# Patient Record
Sex: Male | Born: 1970
Health system: Southern US, Community
[De-identification: ages and names within clinical notes are randomized; demographics above are authoritative.]

## PROBLEM LIST (undated history)

## (undated) DIAGNOSIS — S2249XA Multiple fractures of ribs, unspecified side, initial encounter for closed fracture: Secondary | ICD-10-CM

## (undated) DIAGNOSIS — J189 Pneumonia, unspecified organism: Secondary | ICD-10-CM

## (undated) DIAGNOSIS — K859 Acute pancreatitis without necrosis or infection, unspecified: Secondary | ICD-10-CM

## (undated) DIAGNOSIS — F1090 Alcohol use, unspecified, uncomplicated: Secondary | ICD-10-CM

## (undated) DIAGNOSIS — D649 Anemia, unspecified: Secondary | ICD-10-CM

## (undated) HISTORY — PX: ROTATOR CUFF REPAIR: SHX139

---

## 2008-09-23 ENCOUNTER — Emergency Department (HOSPITAL_BASED_OUTPATIENT_CLINIC_OR_DEPARTMENT_OTHER): Admission: EM | Admit: 2008-09-23 | Discharge: 2008-09-23 | Payer: Self-pay | Admitting: Emergency Medicine

## 2008-11-16 ENCOUNTER — Ambulatory Visit: Payer: Self-pay | Admitting: Radiology

## 2008-11-16 ENCOUNTER — Emergency Department (HOSPITAL_BASED_OUTPATIENT_CLINIC_OR_DEPARTMENT_OTHER): Admission: EM | Admit: 2008-11-16 | Discharge: 2008-11-16 | Payer: Self-pay | Admitting: Emergency Medicine

## 2009-10-01 ENCOUNTER — Emergency Department (HOSPITAL_BASED_OUTPATIENT_CLINIC_OR_DEPARTMENT_OTHER): Admission: EM | Admit: 2009-10-01 | Discharge: 2009-10-01 | Payer: Self-pay | Admitting: Emergency Medicine

## 2010-07-02 LAB — RPR: RPR Ser Ql: NONREACTIVE

## 2014-03-06 ENCOUNTER — Emergency Department (HOSPITAL_BASED_OUTPATIENT_CLINIC_OR_DEPARTMENT_OTHER)
Admission: EM | Admit: 2014-03-06 | Discharge: 2014-03-06 | Disposition: A | Discharge status: Home / Self Care | Payer: 59 | Attending: Emergency Medicine | Admitting: Emergency Medicine

## 2014-03-06 ENCOUNTER — Emergency Department (HOSPITAL_BASED_OUTPATIENT_CLINIC_OR_DEPARTMENT_OTHER): Payer: 59

## 2014-03-06 ENCOUNTER — Encounter (HOSPITAL_BASED_OUTPATIENT_CLINIC_OR_DEPARTMENT_OTHER): Payer: Self-pay | Admitting: *Deleted

## 2014-03-06 DIAGNOSIS — M546 Pain in thoracic spine: Secondary | ICD-10-CM

## 2014-03-06 DIAGNOSIS — Z720 Tobacco use: Secondary | ICD-10-CM | POA: Diagnosis not present

## 2014-03-06 DIAGNOSIS — R109 Unspecified abdominal pain: Secondary | ICD-10-CM | POA: Insufficient documentation

## 2014-03-06 DIAGNOSIS — M549 Dorsalgia, unspecified: Secondary | ICD-10-CM | POA: Diagnosis present

## 2014-03-06 LAB — URINALYSIS, ROUTINE W REFLEX MICROSCOPIC
Bilirubin Urine: NEGATIVE
GLUCOSE, UA: NEGATIVE mg/dL
HGB URINE DIPSTICK: NEGATIVE
Ketones, ur: NEGATIVE mg/dL
LEUKOCYTES UA: NEGATIVE
Nitrite: NEGATIVE
PROTEIN: NEGATIVE mg/dL
SPECIFIC GRAVITY, URINE: 1.015 (ref 1.005–1.030)
Urobilinogen, UA: 1 mg/dL (ref 0.0–1.0)
pH: 6 (ref 5.0–8.0)

## 2014-03-06 MED ORDER — METHOCARBAMOL 500 MG PO TABS: 500.0000 mg | ORAL_TABLET | Freq: Two times a day (BID) | ORAL | Status: DC

## 2014-03-06 MED ORDER — NAPROXEN 500 MG PO TABS: 500.0000 mg | ORAL_TABLET | Freq: Two times a day (BID) | ORAL | Status: DC

## 2014-03-06 NOTE — ED Notes (Signed)
C/o rt mid to lower flank pain. Onset yesterday am when he woke up. Denies heavy lifting prior. No problems urinating.

## 2014-03-06 NOTE — Discharge Instructions (Signed)
Recheck with cough or fever, rash along the painful area, or any other changes. Avoid heavy lifting bending or physical activity until your symptoms have improved.  Back Pain, Adult Low back pain is very common. About 1 in 5 people have back pain.The cause of low back pain is rarely dangerous. The pain often gets better over time.About half of people with a sudden onset of back pain feel better in just 2 weeks. About 8 in 10 people feel better by 6 weeks.  CAUSES Some common causes of back pain include:  Strain of the muscles or ligaments supporting the spine.  Wear and tear (degeneration) of the spinal discs.  Arthritis.  Direct injury to the back. DIAGNOSIS Most of the time, the direct cause of low back pain is not known.However, back pain can be treated effectively even when the exact cause of the pain is unknown.Answering your caregiver's questions about your overall health and symptoms is one of the most accurate ways to make sure the cause of your pain is not dangerous. If your caregiver needs more information, he or she may order lab work or imaging tests (X-rays or MRIs).However, even if imaging tests show changes in your back, this usually does not require surgery. HOME CARE INSTRUCTIONS For many people, back pain returns.Since low back pain is rarely dangerous, it is often a condition that people can learn to [facility]ageon their own.   Remain active. It is stressful on the back to sit or stand in one place. Do not sit, drive, or stand in one place for more than 30 minutes at a time. Take short walks on level surfaces as soon as pain allows.Try to increase the length of time you walk each day.  Do not stay in bed.Resting more than 1 or 2 days can delay your recovery.  Do not avoid exercise or work.Your body is made to move.It is not dangerous to be active, even though your back may hurt.Your back will likely heal faster if you return to being active before your pain is  gone.  Pay attention to your body when you bend and lift. Many people have less discomfortwhen lifting if they bend their knees, keep the load close to their bodies,and avoid twisting. Often, the most comfortable positions are those that put less stress on your recovering back.  Find a comfortable position to sleep. Use a firm mattress and lie on your side with your knees slightly bent. If you lie on your back, put a pillow under your knees.  Only take over-the-counter or prescription medicines as directed by your caregiver. Over-the-counter medicines to reduce pain and inflammation are often the most helpful.Your caregiver may prescribe muscle relaxant drugs.These medicines help dull your pain so you can more quickly return to your normal activities and healthy exercise.  Put ice on the injured area.  Put ice in a plastic bag.  Place a towel between your skin and the bag.  Leave the ice on for 15-20 minutes, 03-04 times a day for the first 2 to 3 days. After that, ice and heat may be alternated to reduce pain and spasms.  Ask your caregiver about trying back exercises and gentle massage. This may be of some benefit.  Avoid feeling anxious or stressed.Stress increases muscle tension and can worsen back pain.It is important to recognize when you are anxious or stressed and learn ways to manage it.Exercise is a great option. SEEK MEDICAL CARE IF:  You have pain that is not relieved with  rest or medicine.  You have pain that does not improve in 1 week.  You have new symptoms.  You are generally not feeling well. SEEK IMMEDIATE MEDICAL CARE IF:   You have pain that radiates from your back into your legs.  You develop new bowel or bladder control problems.  You have unusual weakness or numbness in your arms or legs.  You develop nausea or vomiting.  You develop abdominal pain.  You feel faint. Document Released: 03/11/2005 Document Revised: 09/10/2011 Document Reviewed:  07/13/2013 [facility] Patient Information 2015 [place] [employer], [place]. This information is not intended to replace advice given to you by your health care provider. Make sure you discuss any questions you have with your health care provider.

## 2014-03-06 NOTE — ED Provider Notes (Signed)
CSN: [id number]     Arrival date & time 03/06/14  [id number] History   None    Chief Complaint  Patient presents with  . Back Pain     HPI  Patient presented for evaluation of atraumatic right flank pain. Awakened yesterday morning and states his back was sore. Occasional spasms when he lifts or bends. No urinary symptoms. No migration of pain to the anterior abdomen or lower abdomen. No groin or testicle pain. No cough sputum production. No rash or vesicles. No skin sensitivity. No past similar episodes. He does not recall any exact inciting events for pain. 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  History reviewed. No pertinent past medical history. History reviewed. No pertinent past surgical history. No family history on file. History  Substance Use Topics  . Smoking status: Current Every Day Smoker -- 1.00 packs/day  . Smokeless tobacco: Not on file  . Alcohol Use: Not on file    Review of Systems  Constitutional: Negative for fever, chills, diaphoresis, appetite change and fatigue.  HENT: Negative for mouth sores, sore throat and trouble swallowing.   Eyes: Negative for visual disturbance.  Respiratory: Negative for cough, chest tightness, shortness of breath and wheezing.   Cardiovascular: Negative for chest pain.  Gastrointestinal: Negative for nausea, vomiting, abdominal pain, diarrhea and abdominal distention.  Endocrine: Negative for polydipsia, polyphagia and polyuria.  Genitourinary: Positive for flank pain. Negative for dysuria, frequency and hematuria.  Musculoskeletal: Negative for gait problem.  Skin: Negative for color change, pallor and rash.  Neurological: Negative for dizziness, syncope, light-headedness and headaches.  Hematological: Does not bruise/bleed easily.  Psychiatric/Behavioral: Negative for behavioral problems and confusion.      Allergies  Review of patient's allergies indicates no known allergies.  Home Medications   Prior to  Admission medications   Medication Sig Start Date End Date Taking? Authorizing Provider  methocarbamol (ROBAXIN) 500 MG tablet Take 1 tablet (500 mg total) by mouth 2 (two) times daily. 03/06/14   Rolland PorterMark Raylene Carmickle, MD  naproxen (NAPROSYN) 500 MG tablet Take 1 tablet (500 mg total) by mouth 2 (two) times daily. 03/06/14   Rolland PorterMark Rena Sweeden, MD   BP 142/78 mmHg  Pulse 109  Temp(Src) 98.6 F (37 C) (Oral)  Resp 18  Ht 5\' 10"  (1.778 m)  Wt 180 lb (81.647 kg)  BMI 25.83 kg/m2  SpO2 97% Physical Exam  Constitutional: He is oriented to person, place, and time. He appears well-developed and well-nourished. No distress.  HENT:  Head: Normocephalic.  Eyes: Conjunctivae are normal. Pupils are equal, round, and reactive to light. No scleral icterus.  Neck: Normal range of motion. Neck supple. No thyromegaly present.  Cardiovascular: Normal rate and regular rhythm.  Exam reveals no gallop and no friction rub.   No murmur heard. Pulmonary/Chest: Effort normal and breath sounds normal. No respiratory distress. He has no wheezes. He has no rales.  Abdominal: Soft. Bowel sounds are normal. He exhibits no distension. There is no tenderness. There is no rebound.  Musculoskeletal: Normal range of motion.       Back:  Neurological: He is alert and oriented to person, place, and time.  Skin: Skin is warm and dry. No rash noted.  Psychiatric: He has a normal mood and affect. His behavior is normal.    ED Course  Procedures (including critical care time) Labs Review Labs Reviewed  URINALYSIS, ROUTINE W REFLEX MICROSCOPIC    Imaging Review Dg Chest 2 View  03/06/2014  CLINICAL DATA:  43 year old male with 1 day history of right upper back pain  EXAM: CHEST  2 VIEW  COMPARISON:  Prior chest x-ray 11/16/2008  FINDINGS: The lungs are clear and negative for focal airspace consolidation, pulmonary edema or suspicious pulmonary nodule. No pleural effusion or pneumothorax. Cardiac and mediastinal contours are within  normal limits. No acute fracture or lytic or blastic osseous lesions. The visualized upper abdominal bowel gas pattern is unremarkable.  IMPRESSION: Negative chest x-ray.   Electronically Signed   By: Malachy MoanHeath  McCullough M.D.   On: 03/06/2014 10:34     EKG Interpretation None      MDM   Final diagnoses:  Thoracic back pain    Pain is reproducible on exam. Clear lungs. Normal chest x-ray. No colic symptoms and normal urine. No rash, or vesicles to suggest zoster. Discussed with him to be reevaluated should he develop any signs suggestive of zoster. Otherwise plan is anti-inflammatories muscle relaxants expectant management. Avoid she knows activity heavy lifting or bending recheck with any evolution of symptoms.    Rolland PorterMark Maitri Schnoebelen, MD 03/06/14 [phone]

## 2015-10-14 ENCOUNTER — Other Ambulatory Visit: Payer: Self-pay | Admitting: Family Medicine

## 2015-10-14 DIAGNOSIS — R748 Abnormal levels of other serum enzymes: Secondary | ICD-10-CM

## 2015-10-24 ENCOUNTER — Ambulatory Visit
Admission: RE | Admit: 2015-10-24 | Discharge: 2015-10-24 | Disposition: A | Discharge status: Home / Self Care | Payer: 59 | Source: Ambulatory Visit | Attending: Family Medicine | Admitting: Family Medicine

## 2015-10-24 DIAGNOSIS — R748 Abnormal levels of other serum enzymes: Secondary | ICD-10-CM

## 2016-03-27 MED FILL — HYDROCODON-APAP 5-325: 5-325 | 3 days supply | Qty: 15 | Fill #0

## 2016-12-11 ENCOUNTER — Encounter (HOSPITAL_BASED_OUTPATIENT_CLINIC_OR_DEPARTMENT_OTHER): Payer: Self-pay | Admitting: *Deleted

## 2016-12-11 ENCOUNTER — Emergency Department (HOSPITAL_BASED_OUTPATIENT_CLINIC_OR_DEPARTMENT_OTHER)
Admission: EM | Admit: 2016-12-11 | Discharge: 2016-12-11 | Disposition: A | Discharge status: Home / Self Care | Payer: 59 | Attending: Emergency Medicine | Admitting: Emergency Medicine

## 2016-12-11 ENCOUNTER — Emergency Department (HOSPITAL_BASED_OUTPATIENT_CLINIC_OR_DEPARTMENT_OTHER): Payer: 59

## 2016-12-11 DIAGNOSIS — R109 Unspecified abdominal pain: Secondary | ICD-10-CM | POA: Diagnosis not present

## 2016-12-11 DIAGNOSIS — F17200 Nicotine dependence, unspecified, uncomplicated: Secondary | ICD-10-CM | POA: Diagnosis not present

## 2016-12-11 DIAGNOSIS — K859 Acute pancreatitis without necrosis or infection, unspecified: Secondary | ICD-10-CM | POA: Insufficient documentation

## 2016-12-11 DIAGNOSIS — R1031 Right lower quadrant pain: Secondary | ICD-10-CM | POA: Diagnosis not present

## 2016-12-11 HISTORY — DX: Acute pancreatitis without necrosis or infection, unspecified: K85.90

## 2016-12-11 LAB — URINALYSIS, ROUTINE W REFLEX MICROSCOPIC
GLUCOSE, UA: NEGATIVE mg/dL
KETONES UR: NEGATIVE mg/dL
Leukocytes, UA: NEGATIVE
Nitrite: NEGATIVE
PH: 6 (ref 5.0–8.0)
PROTEIN: NEGATIVE mg/dL
SPECIFIC GRAVITY, URINE: 1.025 (ref 1.005–1.030)

## 2016-12-11 LAB — CBC
HEMATOCRIT: 37.5 % — AB (ref 39.0–52.0)
HEMOGLOBIN: 13.3 g/dL (ref 13.0–17.0)
MCH: 36.4 pg — ABNORMAL HIGH (ref 26.0–34.0)
MCHC: 35.5 g/dL (ref 30.0–36.0)
MCV: 102.7 fL — AB (ref 78.0–100.0)
Platelets: 221 10*3/uL (ref 150–400)
RBC: 3.65 MIL/uL — ABNORMAL LOW (ref 4.22–5.81)
RDW: 11.9 % (ref 11.5–15.5)
WBC: 7.8 10*3/uL (ref 4.0–10.5)

## 2016-12-11 LAB — COMPREHENSIVE METABOLIC PANEL
ALBUMIN: 4 g/dL (ref 3.5–5.0)
ALT: 75 U/L — ABNORMAL HIGH (ref 17–63)
ANION GAP: 11 (ref 5–15)
AST: 116 U/L — AB (ref 15–41)
Alkaline Phosphatase: 110 U/L (ref 38–126)
BUN: 9 mg/dL (ref 6–20)
CHLORIDE: 100 mmol/L — AB (ref 101–111)
CO2: 24 mmol/L (ref 22–32)
Calcium: 9.1 mg/dL (ref 8.9–10.3)
Creatinine, Ser: 1.1 mg/dL (ref 0.61–1.24)
GFR calc Af Amer: >60 mL/min (ref >60)
GFR calc non Af Amer: >60 mL/min (ref >60)
GLUCOSE: 117 mg/dL — AB (ref 65–99)
POTASSIUM: 3.9 mmol/L (ref 3.5–5.1)
SODIUM: 135 mmol/L (ref 135–145)
Total Bilirubin: 1.2 mg/dL (ref 0.3–1.2)
Total Protein: 8.3 g/dL — ABNORMAL HIGH (ref 6.5–8.1)

## 2016-12-11 LAB — URINALYSIS, MICROSCOPIC (REFLEX): WBC, UA: NONE SEEN WBC/hpf (ref 0–5)

## 2016-12-11 LAB — LIPASE, BLOOD: LIPASE: 80 U/L — AB (ref 11–51)

## 2016-12-11 MED ORDER — ONDANSETRON HCL 4 MG/2ML IJ SOLN
4.0000 mg | Freq: Once | INTRAMUSCULAR | Status: AC
  Administered 2016-12-11: 4 mg via INTRAVENOUS
  Filled 2016-12-11: qty 2

## 2016-12-11 MED ORDER — IOPAMIDOL (ISOVUE-300) INJECTION 61%
100.0000 mL | Freq: Once | INTRAVENOUS | Status: AC | PRN
  Administered 2016-12-11: 100 mL via INTRAVENOUS

## 2016-12-11 MED ORDER — MORPHINE SULFATE (PF) 4 MG/ML IV SOLN
4.0000 mg | Freq: Once | INTRAVENOUS | Status: AC
  Administered 2016-12-11: 4 mg via INTRAVENOUS
  Filled 2016-12-11: qty 1

## 2016-12-11 MED ORDER — SODIUM CHLORIDE 0.9 % IV BOLUS (SEPSIS)
1000.0000 mL | Freq: Once | INTRAVENOUS | Status: AC
  Administered 2016-12-11: 1000 mL via INTRAVENOUS

## 2016-12-11 MED ORDER — HYDROCODONE-ACETAMINOPHEN 5-325 MG PO TABS: 1.0000 | ORAL_TABLET | Freq: Four times a day (QID) | ORAL | 0 refills | Status: DC | PRN

## 2016-12-11 MED ORDER — MORPHINE SULFATE (PF) 4 MG/ML IV SOLN
4.0000 mg | Freq: Once | INTRAVENOUS | Status: AC
Start: 2016-12-11 — End: 2016-12-11
  Administered 2016-12-11: 4 mg via INTRAVENOUS
  Filled 2016-12-11: qty 1

## 2016-12-11 MED ORDER — ONDANSETRON 4 MG PO TBDP: 4.0000 mg | ORAL_TABLET | Freq: Three times a day (TID) | ORAL | 0 refills | Status: DC | PRN

## 2016-12-11 NOTE — ED Notes (Signed)
ED Provider at bedside. 

## 2016-12-11 NOTE — ED Triage Notes (Signed)
Pt c/o lower back pain and abd, nausea x 1 day

## 2016-12-11 NOTE — ED Provider Notes (Signed)
[facility]   CSN: [id number] Arrival date & time: 12/11/16  1454     History   Chief Complaint Chief Complaint  Patient presents with  . Abdominal Pain    HPI Jamie Campos is a 46 y.o. male with no significant past medical history presenting with 24 hours of right lower quadrant pain radiating to his back. He also endorses associated nausea but no vomiting, denies diarrhea. Has had a normal bowel movement this morning. Denies any penile discharge, groin pain, testicular swelling or pain. No prior history of abdominal surgeries Denies dysuria, hematuria, reports dark urine and poor hydration status.   Patient is a current every day smoker 1 pack per day and daily alcohol consumer.  HPI  Past Medical History:  Diagnosis Date  . Acute pancreatitis     There are no active problems to display for this patient.   Past Surgical History:  Procedure Laterality Date  . ROTATOR CUFF REPAIR         Home Medications    Prior to Admission medications   Medication Sig Start Date End Date Taking? Authorizing Provider  HYDROcodone-acetaminophen (NORCO/VICODIN) 5-325 MG tablet Take 1 tablet by mouth every 6 (six) hours as needed for severe pain. 12/11/16   Mathews Robinsons B, PA-C  ondansetron (ZOFRAN ODT) 4 MG disintegrating tablet Take 1 tablet (4 mg total) by mouth every 8 (eight) hours as needed for nausea or vomiting. 12/11/16   Georgiana Shore, PA-C    Family History History reviewed. No pertinent family history.  Social History Social History  Substance Use Topics  . Smoking status: Current Every Day Smoker    Packs/day: 1.00  . Smokeless tobacco: Never Used  . Alcohol use 1.8 oz/week    3 Cans of beer per week     Allergies   Patient has no known allergies.   Review of Systems Review of Systems  Constitutional: Negative for chills, diaphoresis, fatigue and fever.  Eyes: Negative for pain and visual disturbance.    Respiratory: Negative for cough, shortness of breath, wheezing and stridor.   Cardiovascular: Negative for chest pain and palpitations.  Gastrointestinal: Positive for abdominal pain and nausea. Negative for abdominal distention, blood in stool, diarrhea and vomiting.  Genitourinary: Negative for difficulty urinating, dysuria, flank pain, frequency and hematuria.  Musculoskeletal: Negative for arthralgias, back pain, myalgias, neck pain and neck stiffness.  Skin: Negative for color change, pallor and rash.  Neurological: Negative for dizziness, seizures, syncope, weakness, light-headedness and headaches.     Physical Exam Updated Vital Signs BP (!) 134/99 (BP Location: Right Arm)   Pulse 90   Temp 98.4 F (36.9 C)   Resp 18   Ht  (1.778 m)   Wt 77.1 kg (170 lb)   SpO2 95%   BMI 24.39 kg/m   Physical Exam  Constitutional: He appears well-developed and well-nourished. No distress.  Afebrile, nontoxic-appearing, lying in bed in mild discomfort.  HENT:  Head: Normocephalic and atraumatic.  Mouth/Throat: Oropharynx is clear and moist. No oropharyngeal exudate.  Eyes: Conjunctivae and EOM are normal.  Neck: Normal range of motion. Neck supple.  Cardiovascular: Normal rate, regular rhythm and normal heart sounds.   No murmur heard. Pulmonary/Chest: Effort normal and breath sounds normal. No respiratory distress. He has no wheezes. He has no rales.  Abdominal: Soft. Bowel sounds are normal. He exhibits no distension and no mass. There is tenderness. There is no rebound and no guarding.  Tenderness to  palpation of the right lower quadrant and right upper quadrant. No peritoneal signs, no distention. Negative Murphy sign.   Musculoskeletal: Normal range of motion. He exhibits no edema.  Neurological: He is alert.  Skin: Skin is warm and dry. No rash noted. He is not diaphoretic. No erythema. No pallor.  Psychiatric: He has a normal mood and affect.  Nursing note and vitals  reviewed.    ED Treatments / Results  Labs (all labs ordered are listed, but only abnormal results are displayed) Labs Reviewed  LIPASE, BLOOD - Abnormal; Notable for the following:       Result Value   Lipase 80 (*)    All other components within normal limits  COMPREHENSIVE METABOLIC PANEL - Abnormal; Notable for the following:    Chloride 100 (*)    Glucose, Bld 117 (*)    Total Protein 8.3 (*)    AST 116 (*)    ALT 75 (*)    All other components within normal limits  CBC - Abnormal; Notable for the following:    RBC 3.65 (*)    HCT 37.5 (*)    MCV 102.7 (*)    MCH 36.4 (*)    All other components within normal limits  URINALYSIS, ROUTINE W REFLEX MICROSCOPIC - Abnormal; Notable for the following:    Hgb urine dipstick TRACE (*)    Bilirubin Urine SMALL (*)    All other components within normal limits  URINALYSIS, MICROSCOPIC (REFLEX) - Abnormal; Notable for the following:    Bacteria, UA RARE (*)    Squamous Epithelial / LPF 0-5 (*)    All other components within normal limits    EKG  EKG Interpretation None       Radiology Ct Abdomen Pelvis W Contrast  Result Date: 12/11/2016 CLINICAL DATA:  Abdominal pain EXAM: CT ABDOMEN AND PELVIS WITH CONTRAST TECHNIQUE: Multidetector CT imaging of the abdomen and pelvis was performed using the standard protocol following bolus administration of intravenous contrast. Oral contrast was also administered. CONTRAST:  ISOVUE-300 IOPAMIDOL (ISOVUE-300) INJECTION 61% COMPARISON:  None. FINDINGS: Lower chest: Lung bases are clear. Hepatobiliary: There is hepatic steatosis. There is inhomogeneous decreased attenuation throughout the liver, most marked involving the posterior segment of the right lobe of the liver medially. This appearance is suggestive of more severe fatty infiltration in these areas compared to the remainder of the liver. There is no disruption of hepatic architecture. Gallbladder wall is not appreciably  thickened. There is no biliary duct dilatation. Pancreas: There is focal prominence of the head of the pancreas without evident mass. The remainder of the pancreas appears normal. There is peripancreatic soft tissue stranding and fluid adjacent to the head and uncinate process of the pancreas which extends to the level of the distal stomach and proximal duodenum. Fluid extends to the right of the duodenum to the level of the gallbladder. A small amount of stranding extends more inferiorly to surround a portion of the proximal third portion of duodenum. This appearance is felt to be indicative of acute pancreatitis. There is no pseudocyst or pancreatic duct dilatation. Spleen: Spleen is normal in size and contour. No splenic lesions are evident. Adrenals/Urinary Tract: Adrenals appear normal bilaterally. There is a cyst in the posterior mid right kidney measuring 2.4 x 2.1 cm. There is no appreciable hydronephrosis on either side. There is no renal or ureteral calculus on either side. Urinary bladder is midline with wall thickness within normal limits. Stomach/Bowel: There is wall thickening in  the distal gastric antrum and proximal duodenum, likely due to the adjacent peripancreatic fluid. There is also mild wall thickening in the proximal third portion the duodenum, likely also due to the adjacent peripancreatic fluid. There is no other bowel wall thickening. No bowel obstruction evident. No free air or portal venous air. Vascular/Lymphatic: There is atherosclerotic calcification in the aorta and common iliac arteries. There is also calcification in both internal iliac arteries. No evident aneurysm. Major mesenteric vessels appear patent. There is a retroaortic left renal vein, an anatomic variant. There is no evident adenopathy in the abdomen or pelvis. Reproductive: Prostate and seminal vesicles are normal in size and contour. No pelvic mass evident. Other: Appendix appears normal. There is no abscess or ascites  in the abdomen or pelvis. Musculoskeletal: There are no blastic or lytic bone lesions. There is no intramuscular or abdominal wall lesion. IMPRESSION: 1. Evidence of acute pancreatitis involving portions of the head and uncinate process of the pancreas. There is edema in the head of pancreas. There is peripancreatic fluid surrounding the head and uncinate process extending lateral to surround much of the proximal duodenum as well as to extend to the gallbladder without causing gallbladder wall thickening. Fluid extends anteriorly to abut the gastric antrum and posteriorly to abut the third portion of the duodenum. No pancreatic mass is demonstrable. The body and tail of the pancreas appear normal. 2. Distal gastritis and areas of duodenitis, likely due to adjacent peripancreatic fluid. No bowel obstruction. No abscess noted in the abdomen or pelvis. 3.  Appendix appears normal. 4. Hepatic steatosis. The liver as a somewhat unusual appearance with multiple areas of decreased attenuation beyond the generalized hepatic steatosis. Suspect that these areas represent more severe fatty infiltration than elsewhere; note that there is no hepatic architecture disruption on this study. Given this somewhat unusual appearance, abdominal MRI pre and post-contrast non emergently is advised to further evaluate. MR also could be helpful further assessing the changes of apparent pancreatitis, in particular to exclude possible subtle underlying mass, not seen on the CT examination. 5.  Aortoiliac atherosclerosis, somewhat advanced for age. 6.  No renal or ureteral calculus.  No hydronephrosis. Aortic Atherosclerosis (ICD10-I70.0). Electronically Signed   By: Bretta Bang III M.D.   On: 12/11/2016 19:05    Procedures Procedures (including critical care time)  Medications Ordered in ED Medications  ondansetron (ZOFRAN) injection 4 mg (4 mg Intravenous Given 12/11/16 1706)  morphine 4 MG/ML injection 4 mg (4 mg Intravenous  Given 12/11/16 1706)  sodium chloride 0.9 % bolus 1,000 mL (0 mLs Intravenous Stopped 12/11/16 1839)  iopamidol (ISOVUE-300) 61 % injection 100 mL (100 mLs Intravenous Contrast Given 12/11/16 1838)  morphine 4 MG/ML injection 4 mg (4 mg Intravenous Given 12/11/16 1900)     Initial Impression / Assessment and Plan / ED Course  I have reviewed the triage vital signs and the nursing notes.  Pertinent labs & imaging results that were available during my care of the patient were reviewed by me and considered in my medical decision making (see chart for details).    Patient presenting with sudden onset abdominal pain with associated nausea.  CT with evidence of acute pancreatitis, lipase 80. No other acute findings. Patient received fluids and pain was managed. He improved while in the emergency department. successful PO challenge.  Discussed smoking cessation and reduced alcohol consumption with patient and advised to follow-up with primary care for assistance with this.  Discharge with gastroenterology follow-up for further evaluation  of incidental findings on CT recommending non-emergent MRI. Results discussed with patient who understood and will be following up.  Discharge home with symptomatic relief. Advised to start reintroducing foods slowly and remain well-hydrated.  Discussed strict return precautions and advised to return to the emergency department if experiencing any new or worsening symptoms. Instructions were understood and patient agreed with discharge plan.  Final Clinical Impressions(s) / ED Diagnoses   Final diagnoses:  Acute pancreatitis without infection or necrosis, unspecified pancreatitis type    New Prescriptions Discharge Medication List as of 12/11/2016  8:48 PM    START taking these medications   Details  HYDROcodone-acetaminophen (NORCO/VICODIN) 5-325 MG tablet Take 1 tablet by mouth every 6 (six) hours as needed for severe pain., Starting Wed 12/11/2016, Print     ondansetron (ZOFRAN ODT) 4 MG disintegrating tablet Take 1 tablet (4 mg total) by mouth every 8 (eight) hours as needed for nausea or vomiting., Starting Wed 12/11/2016, Print         Georgiana Shore, PA-C 12/11/16 2115    Rolland Porter, MD 12/24/16 2027

## 2016-12-11 NOTE — ED Notes (Signed)
Pt and family are asking about test results. Encouraged PA to go speak with pt.

## 2016-12-11 NOTE — Discharge Instructions (Signed)
As discussed, please follow up with your Primary care provider and gastroenterology. Make sure that you stay well-hydrated drinking plenty of fluids to keep your urine clear. Only take pain medicine as needed for severe pain. Zofran as needed for nausea. Start reintroducing foods slowly starting with liquids, broths, soups, and bland foods.  Avoid alcohol, spicy foods, smoking. Discussed smoking cessation with your primary care provider. Return if symptoms worsen or you experience new concerning symptoms in the meantime.

## 2016-12-12 DIAGNOSIS — K760 Fatty (change of) liver, not elsewhere classified: Secondary | ICD-10-CM | POA: Diagnosis not present

## 2016-12-12 DIAGNOSIS — I7090 Unspecified atherosclerosis: Secondary | ICD-10-CM | POA: Diagnosis not present

## 2016-12-12 DIAGNOSIS — K852 Alcohol induced acute pancreatitis without necrosis or infection: Secondary | ICD-10-CM | POA: Diagnosis not present

## 2016-12-13 ENCOUNTER — Other Ambulatory Visit: Payer: Self-pay | Admitting: Family Medicine

## 2016-12-13 DIAGNOSIS — K760 Fatty (change of) liver, not elsewhere classified: Secondary | ICD-10-CM

## 2016-12-21 ENCOUNTER — Ambulatory Visit
Admission: RE | Admit: 2016-12-21 | Discharge: 2016-12-21 | Disposition: A | Discharge status: Home / Self Care | Payer: 59 | Source: Ambulatory Visit | Attending: Family Medicine | Admitting: Family Medicine

## 2016-12-21 DIAGNOSIS — K760 Fatty (change of) liver, not elsewhere classified: Secondary | ICD-10-CM

## 2016-12-21 DIAGNOSIS — N281 Cyst of kidney, acquired: Secondary | ICD-10-CM | POA: Diagnosis not present

## 2016-12-21 MED ORDER — GADOBENATE DIMEGLUMINE 529 MG/ML IV SOLN
17.0000 mL | Freq: Once | INTRAVENOUS | Status: AC | PRN
  Administered 2016-12-21: 17 mL via INTRAVENOUS

## 2017-06-05 DIAGNOSIS — M19011 Primary osteoarthritis, right shoulder: Secondary | ICD-10-CM | POA: Diagnosis not present

## 2017-06-05 MED FILL — MELOXICAM 15 MG TABLET: 15 | 30 days supply | Qty: 30 | Fill #0

## 2017-06-24 ENCOUNTER — Other Ambulatory Visit: Payer: Self-pay | Admitting: Family Medicine

## 2017-06-24 DIAGNOSIS — R935 Abnormal findings on diagnostic imaging of other abdominal regions, including retroperitoneum: Secondary | ICD-10-CM

## 2017-07-05 ENCOUNTER — Ambulatory Visit
Admission: RE | Admit: 2017-07-05 | Discharge: 2017-07-05 | Disposition: A | Discharge status: Home / Self Care | Payer: 59 | Source: Ambulatory Visit | Attending: Family Medicine | Admitting: Family Medicine

## 2017-07-05 DIAGNOSIS — R935 Abnormal findings on diagnostic imaging of other abdominal regions, including retroperitoneum: Secondary | ICD-10-CM

## 2017-07-05 DIAGNOSIS — K862 Cyst of pancreas: Secondary | ICD-10-CM | POA: Diagnosis not present

## 2017-07-05 MED ORDER — GADOBENATE DIMEGLUMINE 529 MG/ML IV SOLN
17.0000 mL | Freq: Once | INTRAVENOUS | Status: AC | PRN
  Administered 2017-07-05: 17 mL via INTRAVENOUS

## 2017-07-30 DIAGNOSIS — L988 Other specified disorders of the skin and subcutaneous tissue: Secondary | ICD-10-CM | POA: Diagnosis not present

## 2017-07-30 DIAGNOSIS — R300 Dysuria: Secondary | ICD-10-CM | POA: Diagnosis not present

## 2017-07-30 DIAGNOSIS — H209 Unspecified iridocyclitis: Secondary | ICD-10-CM | POA: Diagnosis not present

## 2017-07-30 MED FILL — CIPROFLOXACIN HCL 500 MG TA: 500 | 10 days supply | Qty: 20 | Fill #0

## 2017-07-30 MED FILL — metroNIDAZOLE 500 MG TABS: 500 | 10 days supply | Qty: 20 | Fill #0

## 2017-08-21 DIAGNOSIS — K8500 Idiopathic acute pancreatitis without necrosis or infection: Secondary | ICD-10-CM | POA: Diagnosis not present

## 2017-08-21 DIAGNOSIS — L732 Hidradenitis suppurativa: Secondary | ICD-10-CM | POA: Diagnosis not present

## 2017-08-26 ENCOUNTER — Other Ambulatory Visit: Payer: Self-pay | Admitting: Surgery

## 2017-08-26 DIAGNOSIS — K8500 Idiopathic acute pancreatitis without necrosis or infection: Secondary | ICD-10-CM

## 2017-09-19 ENCOUNTER — Ambulatory Visit
Admission: RE | Admit: 2017-09-19 | Discharge: 2017-09-19 | Disposition: A | Discharge status: Home / Self Care | Payer: 59 | Source: Ambulatory Visit | Attending: Surgery | Admitting: Surgery

## 2017-09-19 DIAGNOSIS — K8500 Idiopathic acute pancreatitis without necrosis or infection: Secondary | ICD-10-CM

## 2018-07-07 IMAGING — MR MR ABDOMEN WO/W CM
13 of 19 series · 28 of 48 positions shown · IV contrast (17 ml multihance)
Comparison: 12/21/2016

CLINICAL DATA: Followup indeterminate pancreatic lesion.

EXAM:
MRI ABDOMEN WITHOUT AND WITH CONTRAST
TECHNIQUE: Multiplanar multisequence MR imaging of the abdomen was performed
both before and after the administration of intravenous contrast.
CONTRAST:  17mL MULTIHANCE GADOBENATE DIMEGLUMINE 529 MG/ML IV SOLN

[Series 4: T2 · coronal · 5.0mm · 1.41mm/px · 1 of 29 slices shown (1 of 3)]
[im 1/29]
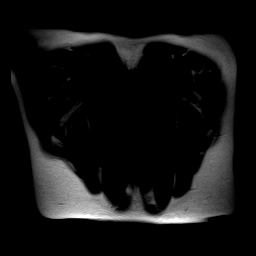

[Series 5: T2 · axial · 5.0mm · 1.41mm/px · 1 of 34 slices shown (2 of 3)]
[im 1/34]
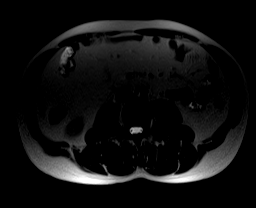

[Series 6: MRCP fat-sat · coronal · 3.0mm · 0.70mm/px · 1 of 21 slices shown]
[im 1/21]
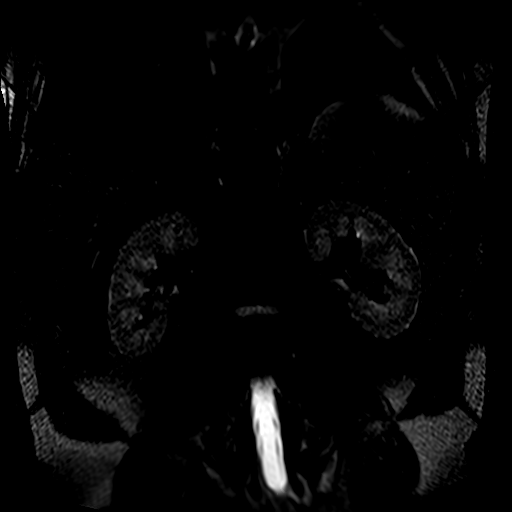

[Series 7: axial in out · axial · 5.5mm · 0.70mm/px · 1 of 68 slices shown]
[im 1/68]
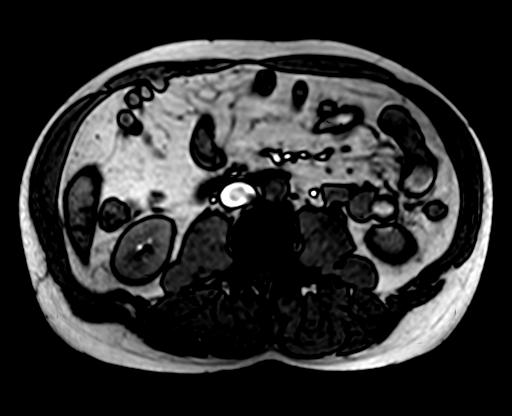

[Series 8: T2 · axial · 5.0mm · 0.70mm/px · 1 of 38 slices shown (3 of 3)]
[im 1/38]
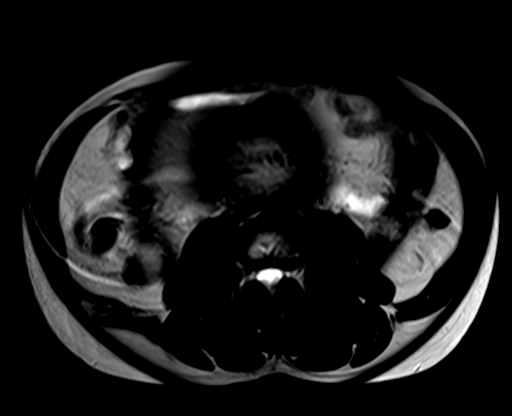

[Series 9: ep2d_diff_b50_500_800_p2_trig · axial · 5.0mm · 1.88mm/px · z∈[-91,+134]mm · 3 of 111 slices shown]
[im 1/111]
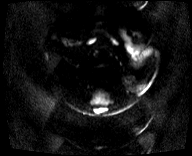
[im 56/111]
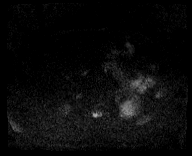
[im 111/111]
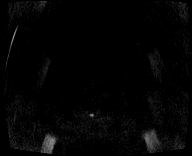

[Series 10: ep2d_diff_b50_500_800_p2_trig_adc · axial · 5.0mm · 1.88mm/px · 1 of 37 slices shown]
[im 1/37]
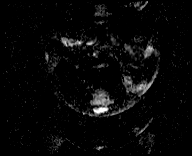

[Series 13: T1 dynamic · axial · non-contrast · 2.0mm · 0.78mm/px · z∈[-82,+140]mm · 3 of 112 slices shown]
[im 1/112]
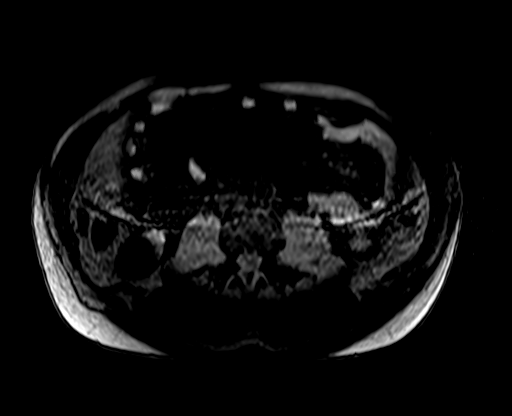
[im 56/112]
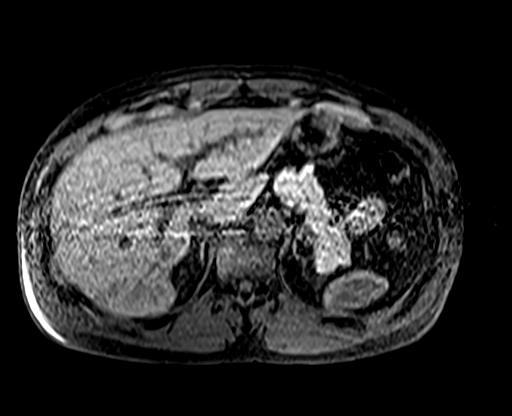
[im 112/112]
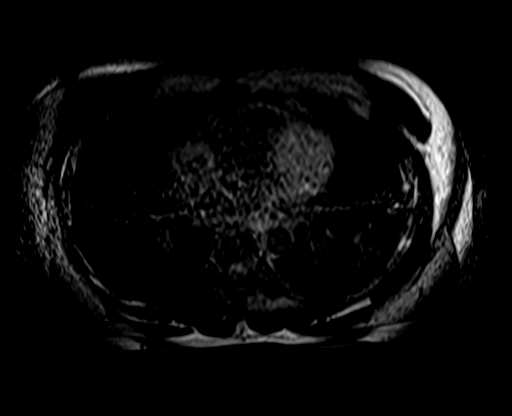

[Series 14: post 25 sec · axial · 2.0mm · 0.78mm/px · z∈[-82,+140]mm · 3 of 112 slices shown]
[im 1/112]
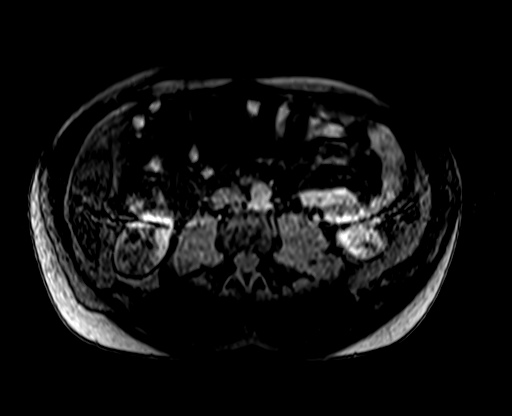
[im 56/112]
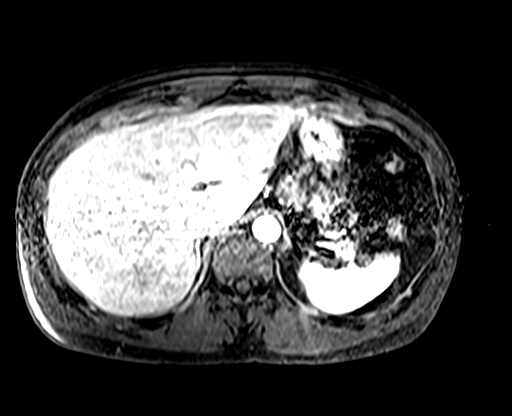
[im 112/112]
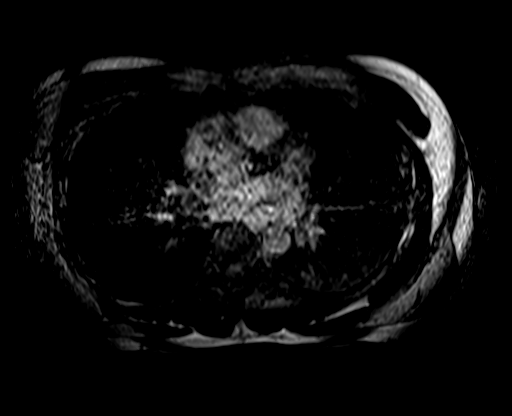

[Series 15: post 25 sec_sub · axial · 2.0mm · 0.78mm/px · z∈[-82,+140]mm · 4 of 112 slices shown]
[im 1/112]
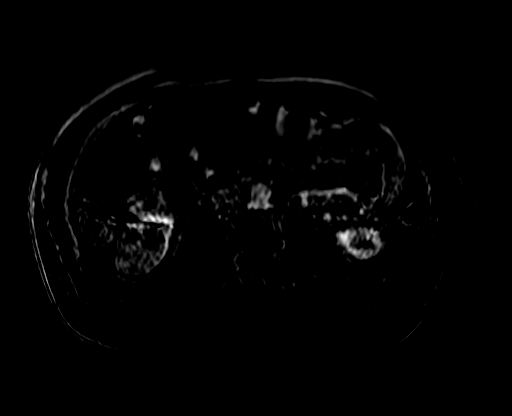
[im 38/112]
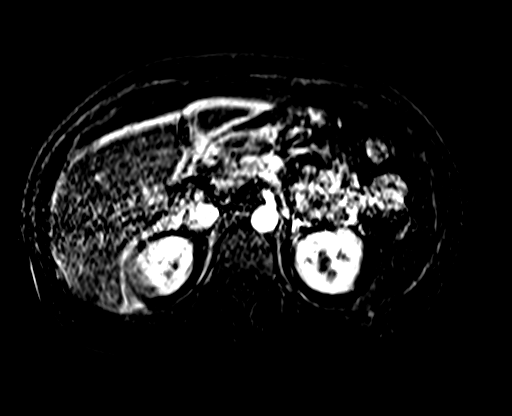
[im 75/112]
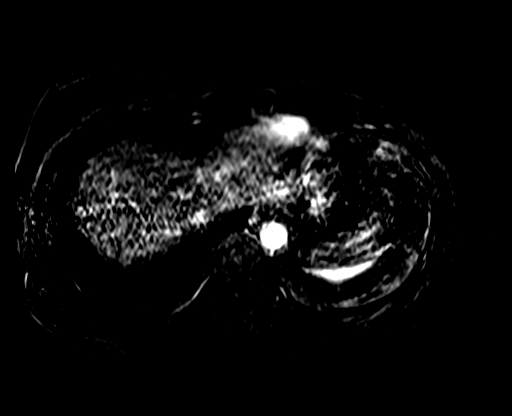
[im 112/112]
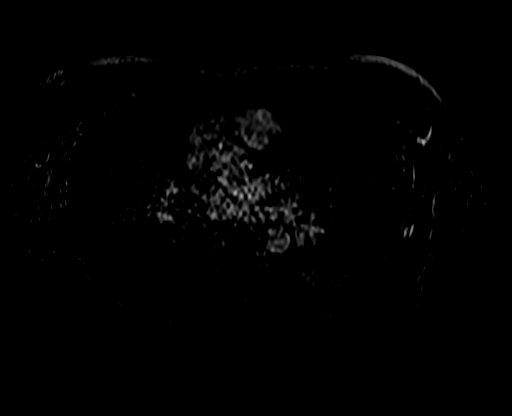

[Series 16: post 45 sec · axial · 2.0mm · 0.78mm/px · z∈[-82,+140]mm · 4 of 112 slices shown]
[im 1/112]
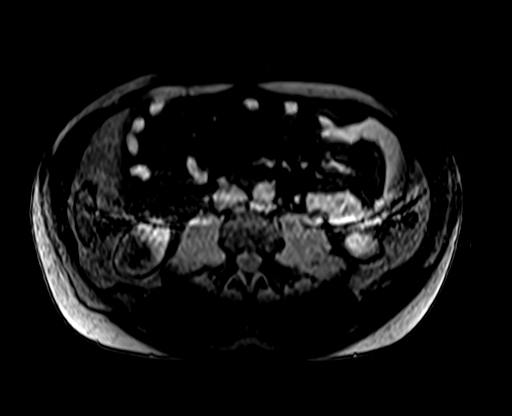
[im 38/112]
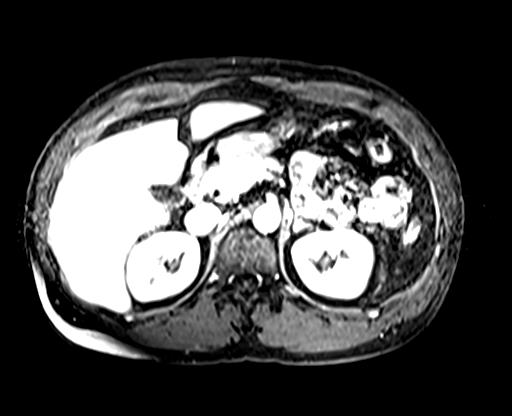
[im 75/112]
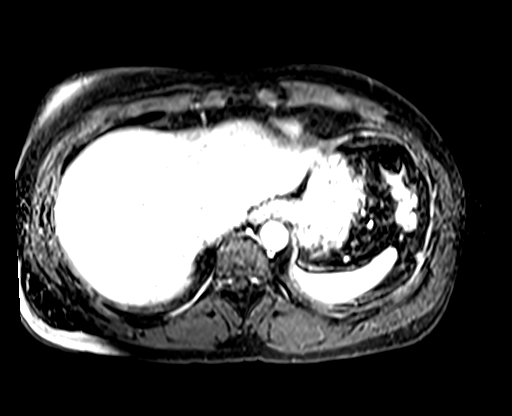
[im 112/112]
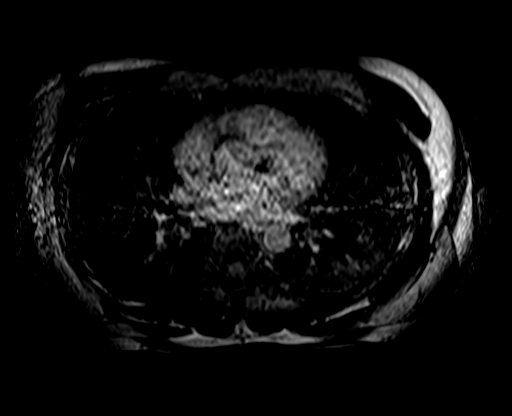

[Series 17: post 45 sec_sub · axial · 2.0mm · 0.78mm/px · z∈[-82,+140]mm · 4 of 112 slices shown]
[im 1/112]
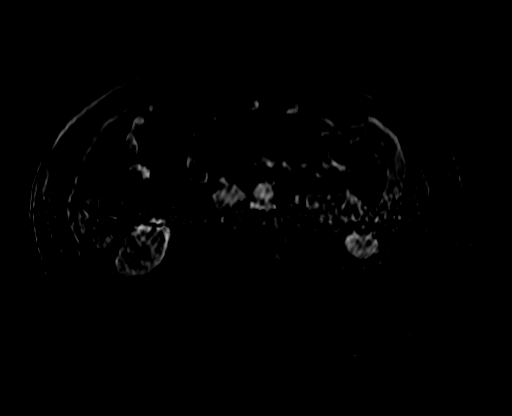
[im 38/112]
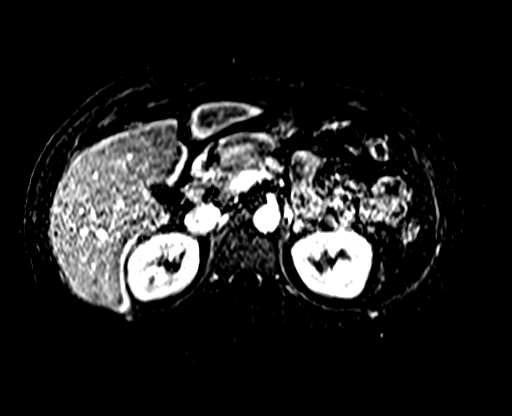
[im 75/112]
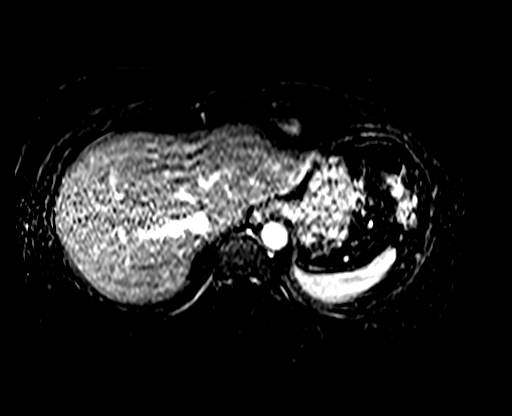
[im 112/112]
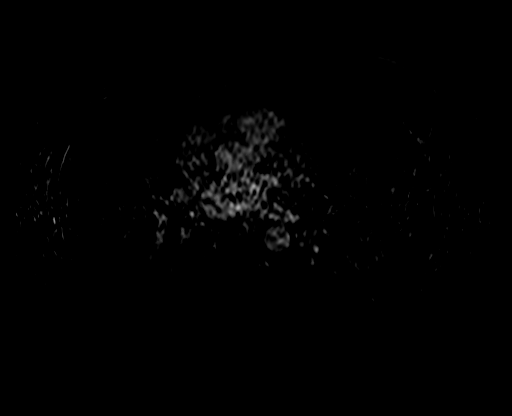

[Series 18: post 90 sec · axial · 2.0mm · 0.78mm/px · 1 of 112 slices shown]
[im 1/112]
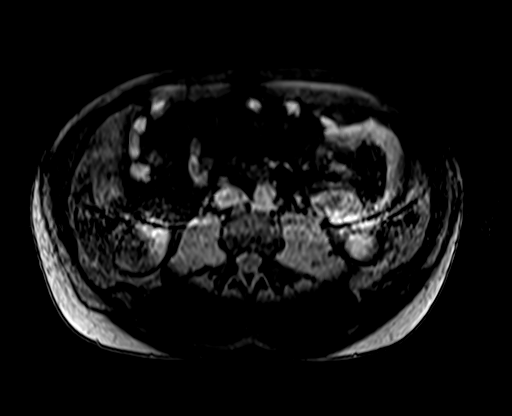

[28 of 48 positions shown; findings below may reference images not displayed]

FINDINGS: Image degradation by motion artifact noted.

Lower chest: No acute findings.

Hepatobiliary: No hepatic masses identified. Geographic areas of
focal fatty infiltration again seen. Gallbladder is unremarkable. No
evidence of biliary duct dilatation or choledocholithiasis.

Pancreas: Ill-defined area of T2 hyperintensity in the pancreatic
head and uncinate process has resolved since previous study,
consistent with resolving focal pancreatitis. No evidence of
pancreatic mass or pancreatic ductal dilatation. No evidence of
pancreatic pseudocyst.

Spleen:  Within normal limits in size and appearance.

Adrenals/Urinary Tract: No masses identified. Stable right renal
cyst. No evidence of hydronephrosis.

Stomach/Bowel: Visualized portion unremarkable.

Vascular/Lymphatic: No pathologically enlarged lymph nodes
identified. No abdominal aortic aneurysm.

Other:  None.

Musculoskeletal:  No suspicious bone lesions identified.
IMPRESSION: Resolution of focal pancreatitis involving the head and uncinate
process since previous study. No evidence of pancreatic mass or
ductal dilatation.

Stable geographic pattern of hepatic steatosis. No evidence of
biliary ductal dilatation.

## 2019-05-11 MED FILL — DOXYCYCLINE HYCLATE 100 MG: 100 | 7 days supply | Qty: 14 | Fill #0

## 2019-05-21 MED FILL — TRIAMCINOLONE 0.1% CREAM: 0.1 | 15 days supply | Qty: 15 | Fill #0

## 2019-06-04 MED FILL — TRIAMCINOLONE 0.1% CREAM: 0.1 | 7 days supply | Qty: 15 | Fill #0

## 2020-08-07 ENCOUNTER — Other Ambulatory Visit: Payer: Self-pay

## 2020-08-07 ENCOUNTER — Emergency Department (HOSPITAL_BASED_OUTPATIENT_CLINIC_OR_DEPARTMENT_OTHER)
Admission: EM | Admit: 2020-08-07 | Discharge: 2020-08-08 | Disposition: A | Discharge status: Home / Self Care | Payer: Managed Care, Other (non HMO) | Attending: Emergency Medicine | Admitting: Emergency Medicine

## 2020-08-07 ENCOUNTER — Encounter (HOSPITAL_BASED_OUTPATIENT_CLINIC_OR_DEPARTMENT_OTHER): Payer: Self-pay | Admitting: *Deleted

## 2020-08-07 ENCOUNTER — Emergency Department (HOSPITAL_BASED_OUTPATIENT_CLINIC_OR_DEPARTMENT_OTHER): Payer: Managed Care, Other (non HMO)

## 2020-08-07 DIAGNOSIS — L03113 Cellulitis of right upper limb: Secondary | ICD-10-CM

## 2020-08-07 DIAGNOSIS — F17200 Nicotine dependence, unspecified, uncomplicated: Secondary | ICD-10-CM | POA: Insufficient documentation

## 2020-08-07 DIAGNOSIS — S6991XA Unspecified injury of right wrist, hand and finger(s), initial encounter: Secondary | ICD-10-CM | POA: Diagnosis present

## 2020-08-07 DIAGNOSIS — S61011A Laceration without foreign body of right thumb without damage to nail, initial encounter: Secondary | ICD-10-CM

## 2020-08-07 DIAGNOSIS — Z23 Encounter for immunization: Secondary | ICD-10-CM | POA: Insufficient documentation

## 2020-08-07 DIAGNOSIS — W25XXXA Contact with sharp glass, initial encounter: Secondary | ICD-10-CM | POA: Insufficient documentation

## 2020-08-07 MED ORDER — TETANUS-DIPHTH-ACELL PERTUSSIS 5-2.5-18.5 LF-MCG/0.5 IM SUSY
0.5000 mL | PREFILLED_SYRINGE | Freq: Once | INTRAMUSCULAR | Status: AC
  Administered 2020-08-07: 0.5 mL via INTRAMUSCULAR
  Filled 2020-08-07: qty 0.5

## 2020-08-07 MED ORDER — LIDOCAINE HCL (PF) 1 % IJ SOLN
10.0000 mL | Freq: Once | INTRAMUSCULAR | Status: AC
  Administered 2020-08-07: 10 mL via INTRADERMAL
  Filled 2020-08-07: qty 10

## 2020-08-07 MED ORDER — BACITRACIN ZINC 500 UNIT/GM EX OINT: TOPICAL_OINTMENT | Freq: Once | CUTANEOUS | Status: DC

## 2020-08-07 NOTE — ED Provider Notes (Signed)
[facility] EMERGENCY DEPARTMENT Provider Note   CSN: [id number] Arrival date & time: 08/07/20  2016     History Chief Complaint  Patient presents with  . Laceration    Jamie Campos is a 50 y.o. male past med history of acute pancreatitis who presents for evaluation of laceration to his right thumb that occurred about 7:30 PM this evening.  Patient reports that he cut it on a piece of glass.  He does not know when his last tetanus shot was.  He can move his finger with any difficulty.  Denies any numbness/weakness.  The history is provided by the patient.       Past Medical History:  Diagnosis Date  . Acute pancreatitis     There are no problems to display for this patient.   Past Surgical History:  Procedure Laterality Date  . ROTATOR CUFF REPAIR         No family history on file.  Social History   Tobacco Use  . Smoking status: Current Every Day Smoker    Packs/day: 1.00  . Smokeless tobacco: Never Used  Vaping Use  . Vaping Use: Never used  Substance Use Topics  . Alcohol use: Yes    Alcohol/week: 3.0 standard drinks    Types: 3 Cans of beer per week  . Drug use: No    Home Medications Prior to Admission medications   Medication Sig Start Date End Date Taking? Authorizing Provider  cephALEXin (KEFLEX) 500 MG capsule Take 1 capsule (500 mg total) by mouth 2 (two) times daily for 7 days. 08/08/20 08/15/20 Yes [name] Caul, PA-C  HYDROcodone-acetaminophen (NORCO/VICODIN) 5-325 MG tablet Take 1 tablet by mouth every 6 (six) hours as needed for severe pain. 12/11/16   Mathews Robinsons B, PA-C  ondansetron (ZOFRAN ODT) 4 MG disintegrating tablet Take 1 tablet (4 mg total) by mouth every 8 (eight) hours as needed for nausea or vomiting. 12/11/16   Mathews Robinsons B, PA-C    Allergies    Patient has no known allergies.  Review of Systems   Review of Systems  Skin: Positive for wound.  Neurological: Negative for weakness and numbness.   All other systems reviewed and are negative.   Physical Exam Updated Vital Signs BP (!) 154/92 (BP Location: Right Arm)   Pulse 71   Temp 98.8 F (37.1 C) (Oral)   Resp 14   Ht 5\' 10"  (1.778 m)   Wt 83.9 kg   SpO2 98%   BMI 26.54 kg/m   Physical Exam Vitals and nursing note reviewed.  Constitutional:      Appearance: He is well-developed.  HENT:     Head: Normocephalic and atraumatic.  Eyes:     General: No scleral icterus.       Right eye: No discharge.        Left eye: No discharge.     Conjunctiva/sclera: Conjunctivae normal.  Cardiovascular:     Pulses:          Radial pulses are 2+ on the right side and 2+ on the left side.  Pulmonary:     Effort: Pulmonary effort is normal.  Musculoskeletal:     Comments: Flexion/tension of the IP joint of the thumb on the right hand intact fine difficulty.  Skin:    General: Skin is warm and dry.     Capillary Refill: Capillary refill takes less than 2 seconds.     Comments: Good distal cap refill. RUE is not  dusky in appearance or cool to touch.  2.5 cm vertical/linear laceration to the volar aspect of the right thumb that crosses the IP joint.  Neurological:     Mental Status: He is alert.     Comments: Sensation intact along major nerve distributions of RUE  Psychiatric:        Speech: Speech normal.        Behavior: Behavior normal.     ED Results / Procedures / Treatments   Labs (all labs ordered are listed, but only abnormal results are displayed) Labs Reviewed - No data to display  EKG None  Radiology DG Finger Thumb Right  Result Date: 08/07/2020 CLINICAL DATA:  Laceration to thumb a piece of broken glass EXAM: RIGHT THUMB 2+V COMPARISON:  None. FINDINGS: Overlying gauze material obscures fine bony and soft tissue detail. There is no evidence of fracture or dislocation. There is no evidence of arthropathy or other focal bone abnormality. No radiopaque foreign body. IMPRESSION: No acute osseous abnormality or  radiopaque foreign body. Electronically Signed   By: Maudry Mayhew MD   On: 08/07/2020 21:09    Procedures .Marland KitchenLaceration Repair  Date/Time: 08/08/2020 12:00 AM Performed by: [name] Caul, PA-C Authorized by: [name] Caul, PA-C   Consent:    Consent obtained:  Verbal   Consent given by:  Patient   Risks discussed:  Infection, need for additional repair, pain, poor cosmetic result and poor wound healing   Alternatives discussed:  No treatment and delayed treatment Universal protocol:    Procedure explained and questions answered to patient or proxy's satisfaction: yes     Relevant documents present and verified: yes     Test results available: yes     Imaging studies available: yes     Required blood products, implants, devices, and special equipment available: yes     Site/side marked: yes     Immediately prior to procedure, a time out was called: yes     Patient identity confirmed:  Verbally with patient Anesthesia:    Anesthesia method:  Local infiltration   Local anesthetic:  Lidocaine 1% w/o epi Laceration details:    Location:  Finger   Finger location:  R thumb   Length (cm):  2.5 Pre-procedure details:    Preparation:  Patient was prepped and draped in usual sterile fashion Exploration:    Hemostasis achieved with:  Direct pressure   Imaging outcome: foreign body not noted     Wound extent: no tendon damage noted   Treatment:    Area cleansed with:  Povidone-iodine   Amount of cleaning:  Extensive   Irrigation solution:  Sterile saline   Irrigation method:  Syringe   Visualized foreign bodies/material removed: no   Skin repair:    Repair method:  Sutures   Suture size:  5-0   Wound skin closure material used: vicryl rapide.   Suture technique:  Simple interrupted   Number of sutures:  8 Approximation:    Approximation:  Close Repair type:    Repair type:  Simple Post-procedure details:    Dressing:  Antibiotic ointment and non-adherent dressing    Procedure completion:  Tolerated Comments:     Once the wound was anesthetized, sterilely extensively irrigated with sterile saline.  No foreign body noted.  Examination shows no evidence of tendon disruption.  Laceration repaired as documented above.     Medications Ordered in ED Medications  bacitracin ointment (has no administration in time range)  Tdap (BOOSTRIX) injection  0.5 mL (0.5 mLs Intramuscular Given 08/07/20 2243)  lidocaine (PF) (XYLOCAINE) 1 % injection 10 mL (10 mLs Intradermal Given 08/07/20 2244)    ED Course  I have reviewed the triage vital signs and the nursing notes.  Pertinent labs & imaging results that were available during my care of the patient were reviewed by me and considered in my medical decision making (see chart for details).    MDM Rules/Calculators/A&P                          50 year old male who presents for evaluation of right thumb laceration that occurred at 7:30 PM.  Reports that he cut it on a piece of glass.  On initial arrival, he is afebrile, toxic appearing.  Vital signs are stable.  On exam, he has a 2.5 cm laceration noted to the volar aspect of his thumb across his IP joint.  He is neurovascularly intact.  X-rays ordered in triage plan for wound care, tetanus.  X-ray reviewed.  No acute bony abnormality.  Laceration repaired as documented above.  Patient tolerated procedure well.  No foreign body noted.  As I was discussing at home supportive care measures with patient, he had me evaluate an area on his right wrist that he says has been bothering him for couple weeks.  He states that he has noticed some drainage from the area.  On my evaluation, he has an area that appears to have some yellow crusting scabbing over the dorsal aspect of the distal forearm.  No surrounding warmth, erythema.  He states he is also had areas like this on his head.  Question bacterial versus fungal.  We will start him on antibiotic.  Patient with no known drug  allergies.  Encouraged at home supportive care measures. At this time, patient exhibits no emergent life-threatening condition that require further evaluation in ED. Patient had ample opportunity for questions and discussion. All patient's questions were answered with full understanding. Strict return precautions discussed. Patient expresses understanding and agreement to plan.   Portions of this note were generated with Scientist, clinical (histocompatibility and immunogenetics). Dictation errors may occur despite best attempts at proofreading.   Final Clinical Impression(s) / ED Diagnoses Final diagnoses:  Laceration of right thumb without foreign body without damage to nail, initial encounter  Cellulitis of right upper extremity    Rx / DC Orders ED Discharge Orders         Ordered    cephALEXin (KEFLEX) 500 MG capsule  2 times daily        08/08/20 0003           [name] Caul, PA-C 08/08/20 0052    Koleen Distance, MD 08/08/20 2148

## 2020-08-07 NOTE — ED Triage Notes (Signed)
Laceration to his right thumb on a piece of broken glass.

## 2020-08-08 MED ORDER — CEPHALEXIN 500 MG PO CAPS: 500.0000 mg | ORAL_CAPSULE | Freq: Two times a day (BID) | ORAL | 0 refills | Status: AC

## 2020-08-08 NOTE — Discharge Instructions (Signed)
Keep the wound clean and dry for the first 24 hours. After that you may gently clean the wound with soap and water. Make sure to pat dry the wound before covering it with any dressing. You can use topical antibiotic ointment and bandage. Ice and elevate for pain relief.   You can take Tylenol or Ibuprofen as directed for pain. You can alternate Tylenol and Ibuprofen every 4 hours for additional pain relief.   In about 7 days, gently tug the end of the stitches.  If they are ready to come out, they will remove easily, if not, and any resistance, wait another day and try again.  Monitor closely for any signs of infection. Return to the Emergency Department for any worsening redness/swelling of the area that begins to spread, drainage from the site, worsening pain, fever or any other worsening or concerning symptoms.

## 2020-08-17 ENCOUNTER — Other Ambulatory Visit (HOSPITAL_BASED_OUTPATIENT_CLINIC_OR_DEPARTMENT_OTHER): Payer: Self-pay

## 2020-08-17 MED ORDER — TRIAMCINOLONE ACETONIDE 0.1 % EX CREA
TOPICAL_CREAM | CUTANEOUS | 0 refills | Status: DC
  Filled 2020-08-17: qty 15, 30d supply, fill #0

## 2020-10-23 ENCOUNTER — Other Ambulatory Visit (HOSPITAL_BASED_OUTPATIENT_CLINIC_OR_DEPARTMENT_OTHER): Payer: Self-pay

## 2020-10-23 MED ORDER — TRIAMCINOLONE ACETONIDE 0.1 % EX CREA
TOPICAL_CREAM | CUTANEOUS | 0 refills | Status: DC
  Filled 2020-10-23: qty 15, 30d supply, fill #0

## 2020-11-01 ENCOUNTER — Other Ambulatory Visit (HOSPITAL_BASED_OUTPATIENT_CLINIC_OR_DEPARTMENT_OTHER): Payer: Self-pay

## 2021-01-05 ENCOUNTER — Other Ambulatory Visit (HOSPITAL_COMMUNITY): Payer: Self-pay

## 2022-03-14 ENCOUNTER — Other Ambulatory Visit: Payer: Self-pay

## 2022-06-15 ENCOUNTER — Emergency Department (HOSPITAL_BASED_OUTPATIENT_CLINIC_OR_DEPARTMENT_OTHER): Payer: Managed Care, Other (non HMO)

## 2022-06-15 ENCOUNTER — Encounter (HOSPITAL_BASED_OUTPATIENT_CLINIC_OR_DEPARTMENT_OTHER): Payer: Self-pay

## 2022-06-15 ENCOUNTER — Inpatient Hospital Stay (HOSPITAL_BASED_OUTPATIENT_CLINIC_OR_DEPARTMENT_OTHER)
Admission: EM | Admit: 2022-06-15 | Discharge: 2022-06-20 | DRG: 184 | Disposition: A | Discharge status: Home / Self Care | Payer: Managed Care, Other (non HMO) | Attending: General Surgery | Admitting: General Surgery

## 2022-06-15 ENCOUNTER — Other Ambulatory Visit: Payer: Self-pay

## 2022-06-15 DIAGNOSIS — S2241XA Multiple fractures of ribs, right side, initial encounter for closed fracture: Principal | ICD-10-CM

## 2022-06-15 DIAGNOSIS — Y92009 Unspecified place in unspecified non-institutional (private) residence as the place of occurrence of the external cause: Secondary | ICD-10-CM

## 2022-06-15 DIAGNOSIS — W19XXXA Unspecified fall, initial encounter: Principal | ICD-10-CM

## 2022-06-15 DIAGNOSIS — J918 Pleural effusion in other conditions classified elsewhere: Secondary | ICD-10-CM | POA: Diagnosis present

## 2022-06-15 DIAGNOSIS — S27321A Contusion of lung, unilateral, initial encounter: Secondary | ICD-10-CM | POA: Diagnosis present

## 2022-06-15 DIAGNOSIS — I10 Essential (primary) hypertension: Secondary | ICD-10-CM | POA: Diagnosis present

## 2022-06-15 DIAGNOSIS — E871 Hypo-osmolality and hyponatremia: Secondary | ICD-10-CM | POA: Diagnosis not present

## 2022-06-15 DIAGNOSIS — Z79899 Other long term (current) drug therapy: Secondary | ICD-10-CM

## 2022-06-15 DIAGNOSIS — Y92012 Bathroom of single-family (private) house as the place of occurrence of the external cause: Secondary | ICD-10-CM

## 2022-06-15 DIAGNOSIS — F17210 Nicotine dependence, cigarettes, uncomplicated: Secondary | ICD-10-CM | POA: Diagnosis present

## 2022-06-15 DIAGNOSIS — F10120 Alcohol abuse with intoxication, uncomplicated: Secondary | ICD-10-CM | POA: Diagnosis present

## 2022-06-15 DIAGNOSIS — R0902 Hypoxemia: Secondary | ICD-10-CM | POA: Diagnosis not present

## 2022-06-15 DIAGNOSIS — J90 Pleural effusion, not elsewhere classified: Secondary | ICD-10-CM

## 2022-06-15 DIAGNOSIS — F10139 Alcohol abuse with withdrawal, unspecified: Secondary | ICD-10-CM | POA: Diagnosis not present

## 2022-06-15 DIAGNOSIS — W182XXA Fall in (into) shower or empty bathtub, initial encounter: Secondary | ICD-10-CM | POA: Diagnosis present

## 2022-06-15 DIAGNOSIS — S270XXA Traumatic pneumothorax, initial encounter: Secondary | ICD-10-CM

## 2022-06-15 DIAGNOSIS — S2249XA Multiple fractures of ribs, unspecified side, initial encounter for closed fracture: Secondary | ICD-10-CM | POA: Diagnosis present

## 2022-06-15 DIAGNOSIS — Y908 Blood alcohol level of 240 mg/100 ml or more: Secondary | ICD-10-CM | POA: Diagnosis present

## 2022-06-15 DIAGNOSIS — K7010 Alcoholic hepatitis without ascites: Secondary | ICD-10-CM

## 2022-06-15 LAB — CBC WITH DIFFERENTIAL/PLATELET
Abs Immature Granulocytes: 0.04 10*3/uL (ref 0.00–0.07)
Basophils Absolute: 0 10*3/uL (ref 0.0–0.1)
Basophils Relative: 0 %
Eosinophils Absolute: 0 10*3/uL (ref 0.0–0.5)
Eosinophils Relative: 0 %
HCT: 36.3 % — ABNORMAL LOW (ref 39.0–52.0)
Hemoglobin: 12.8 g/dL — ABNORMAL LOW (ref 13.0–17.0)
Immature Granulocytes: 0 %
Lymphocytes Relative: 6 %
Lymphs Abs: 0.5 10*3/uL — ABNORMAL LOW (ref 0.7–4.0)
MCH: 34.9 pg — ABNORMAL HIGH (ref 26.0–34.0)
MCHC: 35.3 g/dL (ref 30.0–36.0)
MCV: 98.9 fL (ref 80.0–100.0)
Monocytes Absolute: 0.5 10*3/uL (ref 0.1–1.0)
Monocytes Relative: 5 %
Neutro Abs: 8.2 10*3/uL — ABNORMAL HIGH (ref 1.7–7.7)
Neutrophils Relative %: 89 %
Platelets: 172 10*3/uL (ref 150–400)
RBC: 3.67 MIL/uL — ABNORMAL LOW (ref 4.22–5.81)
RDW: 12.7 % (ref 11.5–15.5)
WBC: 9.3 10*3/uL (ref 4.0–10.5)
nRBC: 0 % (ref 0.0–0.2)

## 2022-06-15 MED ORDER — MORPHINE SULFATE (PF) 4 MG/ML IV SOLN
4.0000 mg | Freq: Once | INTRAVENOUS | Status: AC
  Administered 2022-06-16: 4 mg via INTRAVENOUS
  Filled 2022-06-15: qty 1

## 2022-06-15 MED ORDER — SODIUM CHLORIDE 0.9 % IV BOLUS
1000.0000 mL | Freq: Once | INTRAVENOUS | Status: AC
  Administered 2022-06-16: 1000 mL via INTRAVENOUS

## 2022-06-15 NOTE — ED Provider Notes (Incomplete)
[facility] Provider Note   CSN: [id number] Arrival date & time: 06/15/22  2014     History {Add pertinent medical, surgical, social history, OB history to HPI:1} Chief Complaint  Patient presents with  . Fall    Jamie Campos is a 52 y.o. male history of alcohol use disorder and pancreatitis presented after a fall this evening.  Patient states he had 4-5 beers and 4-5 liquor shots for trying to take a shower when the fell getting into the shower.  Patient denies any prefall symptoms and said he landed on his right side and did not hit his head or lose consciousness.  Patient states he has pain in his right ribs that is exacerbated when he takes a deep breath.  His wife states he had a gurgling cough afterwards.  Patient states he has been having fevers and chills the past 2 days with a cough along with dysuria.  Patient denies chest pain, shortness of breath, change in sensation/motor skills, neck pain, back pain, head pain, vision changes  Home Medications Prior to Admission medications   Medication Sig Start Date End Date Taking? Authorizing Provider  HYDROcodone-acetaminophen (NORCO/VICODIN) 5-325 MG tablet Take 1 tablet by mouth every 6 (six) hours as needed for severe pain. 12/11/16   Avie Echevaria B, PA-C  ondansetron (ZOFRAN ODT) 4 MG disintegrating tablet Take 1 tablet (4 mg total) by mouth every 8 (eight) hours as needed for nausea or vomiting. 12/11/16   Avie Echevaria B, PA-C  triamcinolone cream (KENALOG) 0.1 % Apply 1 application on to the affected area 2 times daily 10/23/20         Allergies    Patient has no known allergies.    Review of Systems   Review of Systems Right rib pain See HPI Physical Exam Updated Vital Signs BP 122/70   Pulse 94   Temp 97.8 F (36.6 C) (Oral)   Resp 20   Ht 5\' 8"  (1.727 m)   Wt 81.6 kg   SpO2 96%   BMI 27.37 kg/m  Physical Exam Constitutional:      General: He is not in  acute distress. HENT:     Head: Normocephalic and atraumatic.     Comments: No mastoid ecchymosis noted No step-off/crepitus/abnormalities palpated    Right Ear: Tympanic membrane, ear canal and external ear normal.     Left Ear: Tympanic membrane, ear canal and external ear normal.     Ears:     Comments: No hemotympanum noted    Nose: Nose normal.     Comments: No septal hematoma noted    Mouth/Throat:     Mouth: Mucous membranes are moist.  Eyes:     Extraocular Movements: Extraocular movements intact.     Conjunctiva/sclera: Conjunctivae normal.     Pupils: Pupils are equal, round, and reactive to light.     Comments: No periorbital ecchymosis  Neck:     Comments: No step-off/crepitus/abnormalities palpated in the midline No tenderness midline Cardiovascular:     Rate and Rhythm: Normal rate and regular rhythm.     Pulses: Normal pulses.     Heart sounds: Normal heart sounds.     Comments: 2+ bilateral radial/dorsalis pedal pulses with regular rate Pulmonary:     Effort: Pulmonary effort is normal. No respiratory distress.     Breath sounds: Rhonchi present.  Abdominal:     Tenderness: There is abdominal tenderness (Epigastric). There is guarding. There is no  rebound.  Musculoskeletal:        General: Normal range of motion.     Cervical back: Normal range of motion and neck supple. No tenderness.     Comments: Tender to palpation right ribs No step-off/crepitus/ecchymosis palpated right ribs  Skin:    General: Skin is warm and dry.     Capillary Refill: Capillary refill takes less than 2 seconds.  Neurological:     General: No focal deficit present.     Mental Status: He is alert and oriented to person, place, and time.     Comments: Sensation intact in all 4 limbs Patient was unable to walk due to rib pain     ED Results / Procedures / Treatments   Labs (all labs ordered are listed, but only abnormal results are displayed) Labs Reviewed  CBC WITH  DIFFERENTIAL/PLATELET - Abnormal; Notable for the following components:      Result Value   RBC 3.67 (*)    Hemoglobin 12.8 (*)    HCT 36.3 (*)    MCH 34.9 (*)    Neutro Abs 8.2 (*)    Lymphs Abs 0.5 (*)    All other components within normal limits  COMPREHENSIVE METABOLIC PANEL    EKG None  Radiology DG Chest 2 View  Result Date: 06/15/2022 CLINICAL DATA:  [address] at home, with right-sided rib pain. EXAM: CHEST - 2 VIEW COMPARISON:  PA Lat 03/06/2014 FINDINGS: The cardiac size is upper limit of normal. No vascular congestion is seen. Age-advanced calcific plaque has increased in the transverse aorta with the mediastinum normally outlined. There is no visible pneumothorax.  Left lung is clear. On the right, there is patchy airspace disease in the lower lobe which is suspected probably due to pulmonary contusions. Underlying pneumonia is difficult to exclude in the proper clinical setting. There are overlying mildly displaced fractures of the right posterior eighth and ninth ribs and of the posteromedial right tenth and eleventh ribs. IMPRESSION: 1. Mildly displaced fractures of the right posterior eighth and ninth ribs and of the posteromedial right tenth and eleventh ribs. No visible pneumothorax. 2. Patchy airspace disease in the right lower lobe is suspected to be pulmonary contusions. Underlying pneumonia is difficult to exclude in the proper clinical setting. 3. Increasing calcific plaque in the transverse aorta since 2015. Electronically Signed   By: Telford Nab M.D.   On: 06/15/2022 21:16    Procedures Procedures  {Document cardiac monitor, telemetry assessment procedure when appropriate:1}  Medications Ordered in ED Medications  morphine (PF) 4 MG/ML injection 4 mg (has no administration in time range)  sodium chloride 0.9 % bolus 1,000 mL (has no administration in time range)    ED Course/ Medical Decision Making/ A&P   {   Click here for ABCD2, HEART and other  calculatorsREFRESH Note before signing :1}                          Medical Decision Making Amount and/or Complexity of Data Reviewed Labs: ordered. Radiology: ordered.  Risk Prescription drug management.   Jamie Campos 52 y.o. presented today for ***. Working DDx that I considered at this time includes, but not limited to, ***.  R/o DDx: ***: These are considered less likely due to history of present illness and physical exam findings  Review of prior external notes: 08/07/2020 ED  Unique Tests and My Interpretation:  EKG: Sinus 87 bpm, no ST abnormalities or blocks noted  Chest x-ray: Mildly displaced posterior eighth and ninth rib fracture along with fractures of the posterior medial 10th and 11th ribs, no pneumothorax CT head without contrast: CT abdomen pelvis with contrast: CBC with differential: Unremarkable Troponin: UA: Ethanol: Lipase: CMP:  Discussion with Independent Historian: Wife and sister  Discussion of Management of Tests: ***  Risk: Low:  - based on diagnostic testing/clinical impression and treatment plan   Medium:  - prescription drug management - decision regarding minor surgery w/identified patient or procedure risk factors - major elective surgery w/o identified patient or procedure risk factors - diagnosis or treatment limited by social determinants of health High:  - drug therapy requiring monitoring for toxicity - major elective surgery w/identified patient or procedure risk factors - emergency major surgery - hospitalization or escalation of hospital-level care - decision not to resuscitate or to de-escalate care because of poor prognosis - parental controlled substances  Risk Stratification Score: None  Staffed with ***  Plan: Patient presented for fall. On exam patient was in no distress however did appear slightly intoxicated.  Patient was given return precautions. Patient stable for discharge at this time.  Patient verbalized  understanding of plan.   {Document critical care time when appropriate:1} {Document review of labs and clinical decision tools ie heart score, Chads2Vasc2 etc:1}  {Document your independent review of radiology images, and any outside records:1} {Document your discussion with family members, caretakers, and with consultants:1} {Document social determinants of health affecting pt's care:1} {Document your decision making why or why not admission, treatments were needed:1} Final Clinical Impression(s) / ED Diagnoses Final diagnoses:  None    Rx / DC Orders ED Discharge Orders     None

## 2022-06-15 NOTE — ED Provider Notes (Signed)
[facility] Provider Note   CSN: [id number] Arrival date & time: 06/15/22  2014     History  Chief Complaint  Patient presents with   Jamie Campos is a 52 y.o. male history of alcohol use disorder and pancreatitis presented after a fall this evening.  Patient states he had 4-5 beers and 4-5 liquor shots for trying to take a shower when the fell getting into the shower.  Patient denies any prefall symptoms and said he landed on his right side and did not hit his head or lose consciousness.  Patient states he has pain in his right ribs that is exacerbated when he takes a deep breath.  His wife states he had a gurgling cough afterwards.  Patient states he has been having fevers and chills the past 2 days with a cough along with dysuria.  Patient denies chest pain, shortness of breath, change in sensation/motor skills, neck pain, back pain, head pain, vision changes, blood thinners  Home Medications Prior to Admission medications   Medication Sig Start Date End Date Taking? Authorizing Provider  HYDROcodone-acetaminophen (NORCO/VICODIN) 5-325 MG tablet Take 1 tablet by mouth every 6 (six) hours as needed for severe pain. 12/11/16   Avie Echevaria B, PA-C  ondansetron (ZOFRAN ODT) 4 MG disintegrating tablet Take 1 tablet (4 mg total) by mouth every 8 (eight) hours as needed for nausea or vomiting. 12/11/16   Avie Echevaria B, PA-C  triamcinolone cream (KENALOG) 0.1 % Apply 1 application on to the affected area 2 times daily 10/23/20         Allergies    Patient has no known allergies.    Review of Systems   Review of Systems Right rib pain See HPI Physical Exam Updated Vital Signs BP 122/70   Pulse 94   Temp 97.8 F (36.6 C) (Oral)   Resp 20   Ht 5\' 8"  (1.727 m)   Wt 81.6 kg   SpO2 96%   BMI 27.37 kg/m  Physical Exam Constitutional:      General: He is not in acute distress. HENT:     Head: Normocephalic and  atraumatic.     Comments: No mastoid ecchymosis noted No step-off/crepitus/abnormalities palpated    Right Ear: Tympanic membrane, ear canal and external ear normal.     Left Ear: Tympanic membrane, ear canal and external ear normal.     Ears:     Comments: No hemotympanum noted    Nose: Nose normal.     Comments: No septal hematoma noted    Mouth/Throat:     Mouth: Mucous membranes are moist.  Eyes:     Extraocular Movements: Extraocular movements intact.     Conjunctiva/sclera: Conjunctivae normal.     Pupils: Pupils are equal, round, and reactive to light.     Comments: No periorbital ecchymosis  Neck:     Comments: No step-off/crepitus/abnormalities palpated in the midline No tenderness midline Cardiovascular:     Rate and Rhythm: Normal rate and regular rhythm.     Pulses: Normal pulses.     Heart sounds: Normal heart sounds.     Comments: 2+ bilateral radial/dorsalis pedal pulses with regular rate Pulmonary:     Effort: Pulmonary effort is normal. No respiratory distress.     Breath sounds: Rhonchi present.  Abdominal:     Tenderness: There is abdominal tenderness (Epigastric). There is guarding. There is no rebound.  Musculoskeletal:  General: Normal range of motion.     Cervical back: Normal range of motion and neck supple. No tenderness.     Comments: Tender to palpation right ribs No step-off/crepitus/ecchymosis palpated right ribs  Skin:    General: Skin is warm and dry.     Capillary Refill: Capillary refill takes less than 2 seconds.  Neurological:     General: No focal deficit present.     Mental Status: He is alert and oriented to person, place, and time.     Sensory: Sensation is intact.     Motor: Motor function is intact.     Coordination: Coordination is intact.     Comments: Sensation intact in all 4 limbs Patient was unable to walk due to rib pain Vision grossly intact Neuro nerves III through XII intact  Psychiatric:     Comments:  Intoxicated     ED Results / Procedures / Treatments   Labs (all labs ordered are listed, but only abnormal results are displayed) Labs Reviewed  CBC WITH DIFFERENTIAL/PLATELET - Abnormal; Notable for the following components:      Result Value   RBC 3.67 (*)    Hemoglobin 12.8 (*)    HCT 36.3 (*)    MCH 34.9 (*)    Neutro Abs 8.2 (*)    Lymphs Abs 0.5 (*)    All other components within normal limits  COMPREHENSIVE METABOLIC PANEL    EKG None  Radiology DG Chest 2 View  Result Date: 06/15/2022 CLINICAL DATA:  [place] at home, with right-sided rib pain. EXAM: CHEST - 2 VIEW COMPARISON:  PA Lat 03/06/2014 FINDINGS: The cardiac size is upper limit of normal. No vascular congestion is seen. Age-advanced calcific plaque has increased in the transverse aorta with the mediastinum normally outlined. There is no visible pneumothorax.  Left lung is clear. On the right, there is patchy airspace disease in the lower lobe which is suspected probably due to pulmonary contusions. Underlying pneumonia is difficult to exclude in the proper clinical setting. There are overlying mildly displaced fractures of the right posterior eighth and ninth ribs and of the posteromedial right tenth and eleventh ribs. IMPRESSION: 1. Mildly displaced fractures of the right posterior eighth and ninth ribs and of the posteromedial right tenth and eleventh ribs. No visible pneumothorax. 2. Patchy airspace disease in the right lower lobe is suspected to be pulmonary contusions. Underlying pneumonia is difficult to exclude in the proper clinical setting. 3. Increasing calcific plaque in the transverse aorta since 2015. Electronically Signed   By: Telford Nab M.D.   On: 06/15/2022 21:16    Procedures Procedures    Medications Ordered in ED Medications  morphine (PF) 4 MG/ML injection 4 mg (has no administration in time range)  sodium chloride 0.9 % bolus 1,000 mL (has no administration in time range)    ED Course/  Medical Decision Making/ A&P                             Medical Decision Making Amount and/or Complexity of Data Reviewed Labs: ordered. Radiology: ordered.  Risk Prescription drug management.   Jamie Campos 52 y.o. presented today for fall. Working DDx that I considered at this time includes, but not limited to, ICH, SAH, subdural/subdural hematoma, basilar skull fracture, pancreatitis, rib fracture, pneumothorax, intra-abdominal hemorrhage, electrolyte abnormalities, ACS.  R/o DDx: Cannot be determined at this time Review of prior external notes: 08/07/2020 ED  Unique  Tests and My Interpretation:  EKG: Sinus 87 bpm, no ST abnormalities or blocks noted Chest x-ray: Mildly displaced posterior eighth and ninth rib fracture along with fractures of the posterior medial 10th and 11th ribs, no pneumothorax CT head without contrast: CT abdomen pelvis with contrast: CBC with differential: Unremarkable Troponin: Pending UA: Pending Ethanol: Pending Lipase: 44 CMP: AST 524, ALT 202  Discussion with Independent Historian: Wife and sister  Discussion of Management of Tests: None at this time  Risk:  Risk Stratification Score: None  Plan: Patient presented for fall. On exam patient was in no distress however did appear slightly intoxicated.  Patient was able to fully participate in a physical exam and had a negative neurologic exam however was not able to assess gait as patient was unable to move or walk due to his rib pain.  On exam patient had guarding and tenderness in his epigastric region which I suspect is most likely his pancreatitis however given his recent fall a CT was ordered to further evaluate.  Patient most likely has an aspiration pneumonia from his drinking and will need antibiotics based on chest x-ray and lung sounds.  Patient is currently on 4L with oxygen saturation of 96%.  Patient be given fluids along with 4 mg morphine for pain management.  Patient stable at  this time.  Patient CMP came back significant for AST 524 and ALT 202 suspicious of alcoholic hepatitis which would correlate with patient's alcohol use.  Patient was signed out to oncoming team at midnight.  At this time we are awaiting scans and labs however patient most likely has alcoholic hepatitis based on his CMP and history.  Patient will most likely need admission due to new oxygen requirement and may need admission due to pulmonary contusions from his trauma.  Patient stable at this time signout.         Final Clinical Impression(s) / ED Diagnoses Final diagnoses:  None    Rx / DC Orders ED Discharge Orders     None         Elvina Sidle 06/16/22 0031    Molpus, Jenny Reichmann, MD 06/16/22 [phone]

## 2022-06-15 NOTE — ED Notes (Signed)
Asked by triage RN to assess patient who had fallen at home complaining of SOB. Patient oxygen saturation 90-93% on RA. HR 68/ RR 15. BBS RT side diminished with noted rhonchi in bases. Patient favoring RT side. Xray ordered.

## 2022-06-15 NOTE — ED Triage Notes (Signed)
Pt to ED by EMS from home with c/o R sided rib pain from fall in the bathroom. Pt believes he hit his ribs when he fell, denies any LOC. Pt endorses drinking since 8am this morning both alcohol and liquor. Pt has a room air SAT of 90%. RT present in triage. Pain is reproducible with deep breaths. Pt noted to have Rhonchi.

## 2022-06-16 ENCOUNTER — Inpatient Hospital Stay (HOSPITAL_COMMUNITY): Payer: Managed Care, Other (non HMO)

## 2022-06-16 ENCOUNTER — Emergency Department (HOSPITAL_BASED_OUTPATIENT_CLINIC_OR_DEPARTMENT_OTHER): Payer: Managed Care, Other (non HMO)

## 2022-06-16 ENCOUNTER — Encounter (HOSPITAL_BASED_OUTPATIENT_CLINIC_OR_DEPARTMENT_OTHER): Payer: Self-pay

## 2022-06-16 DIAGNOSIS — Y92012 Bathroom of single-family (private) house as the place of occurrence of the external cause: Secondary | ICD-10-CM | POA: Diagnosis not present

## 2022-06-16 DIAGNOSIS — I10 Essential (primary) hypertension: Secondary | ICD-10-CM | POA: Diagnosis present

## 2022-06-16 DIAGNOSIS — F17210 Nicotine dependence, cigarettes, uncomplicated: Secondary | ICD-10-CM | POA: Diagnosis present

## 2022-06-16 DIAGNOSIS — S27321A Contusion of lung, unilateral, initial encounter: Secondary | ICD-10-CM | POA: Diagnosis present

## 2022-06-16 DIAGNOSIS — K7010 Alcoholic hepatitis without ascites: Secondary | ICD-10-CM | POA: Diagnosis present

## 2022-06-16 DIAGNOSIS — E871 Hypo-osmolality and hyponatremia: Secondary | ICD-10-CM | POA: Diagnosis not present

## 2022-06-16 DIAGNOSIS — F10139 Alcohol abuse with withdrawal, unspecified: Secondary | ICD-10-CM | POA: Diagnosis not present

## 2022-06-16 DIAGNOSIS — W182XXA Fall in (into) shower or empty bathtub, initial encounter: Secondary | ICD-10-CM | POA: Diagnosis present

## 2022-06-16 DIAGNOSIS — F10120 Alcohol abuse with intoxication, uncomplicated: Secondary | ICD-10-CM | POA: Diagnosis present

## 2022-06-16 DIAGNOSIS — S2241XA Multiple fractures of ribs, right side, initial encounter for closed fracture: Secondary | ICD-10-CM | POA: Diagnosis present

## 2022-06-16 DIAGNOSIS — R0902 Hypoxemia: Secondary | ICD-10-CM | POA: Diagnosis present

## 2022-06-16 DIAGNOSIS — J918 Pleural effusion in other conditions classified elsewhere: Secondary | ICD-10-CM | POA: Diagnosis present

## 2022-06-16 DIAGNOSIS — S270XXA Traumatic pneumothorax, initial encounter: Secondary | ICD-10-CM | POA: Diagnosis present

## 2022-06-16 DIAGNOSIS — Z79899 Other long term (current) drug therapy: Secondary | ICD-10-CM | POA: Diagnosis not present

## 2022-06-16 DIAGNOSIS — S2249XA Multiple fractures of ribs, unspecified side, initial encounter for closed fracture: Secondary | ICD-10-CM | POA: Diagnosis present

## 2022-06-16 DIAGNOSIS — Y908 Blood alcohol level of 240 mg/100 ml or more: Secondary | ICD-10-CM | POA: Diagnosis present

## 2022-06-16 LAB — COMPREHENSIVE METABOLIC PANEL
ALT: 202 U/L — ABNORMAL HIGH (ref 0–44)
AST: 524 U/L — ABNORMAL HIGH (ref 15–41)
Albumin: 3.8 g/dL (ref 3.5–5.0)
Alkaline Phosphatase: 87 U/L (ref 38–126)
Anion gap: 13 (ref 5–15)
BUN: 9 mg/dL (ref 6–20)
CO2: 23 mmol/L (ref 22–32)
Calcium: 8.3 mg/dL — ABNORMAL LOW (ref 8.9–10.3)
Chloride: 102 mmol/L (ref 98–111)
Creatinine, Ser: 1.11 mg/dL (ref 0.61–1.24)
GFR, Estimated: >60 mL/min (ref >60)
Glucose, Bld: 95 mg/dL (ref 70–99)
Potassium: 4.3 mmol/L (ref 3.5–5.1)
Sodium: 138 mmol/L (ref 135–145)
Total Bilirubin: 0.9 mg/dL (ref 0.3–1.2)
Total Protein: 8 g/dL (ref 6.5–8.1)

## 2022-06-16 LAB — URINALYSIS, ROUTINE W REFLEX MICROSCOPIC
Bilirubin Urine: NEGATIVE
Glucose, UA: NEGATIVE mg/dL
Ketones, ur: NEGATIVE mg/dL
Leukocytes,Ua: NEGATIVE
Nitrite: NEGATIVE
Protein, ur: 30 mg/dL — AB
Specific Gravity, Urine: 1.01 (ref 1.005–1.030)
pH: 5.5 (ref 5.0–8.0)

## 2022-06-16 LAB — URINALYSIS, MICROSCOPIC (REFLEX)

## 2022-06-16 LAB — TROPONIN I (HIGH SENSITIVITY)
Troponin I (High Sensitivity): 11 ng/L (ref <18)
Troponin I (High Sensitivity): 9 ng/L (ref <18)

## 2022-06-16 LAB — APTT: aPTT: 24 seconds (ref 24–36)

## 2022-06-16 LAB — PROTIME-INR
INR: 1 (ref 0.8–1.2)
Prothrombin Time: 13.2 seconds (ref 11.4–15.2)

## 2022-06-16 LAB — LIPASE, BLOOD: Lipase: 44 U/L (ref 11–51)

## 2022-06-16 LAB — ETHANOL: Alcohol, Ethyl (B): 307 mg/dL (ref <10)

## 2022-06-16 MED ORDER — NICOTINE 14 MG/24HR TD PT24
14.0000 mg | MEDICATED_PATCH | Freq: Every day | TRANSDERMAL | Status: DC
  Administered 2022-06-16 – 2022-06-20 (×5): 14 mg via TRANSDERMAL
  Filled 2022-06-16 (×5): qty 1

## 2022-06-16 MED ORDER — POLYETHYLENE GLYCOL 3350 17 G PO PACK: 17.0000 g | PACK | Freq: Every day | ORAL | Status: DC | PRN

## 2022-06-16 MED ORDER — METHOCARBAMOL 500 MG PO TABS
500.0000 mg | ORAL_TABLET | Freq: Four times a day (QID) | ORAL | Status: DC
  Administered 2022-06-16 – 2022-06-17 (×5): 500 mg via ORAL
  Filled 2022-06-16 (×5): qty 1

## 2022-06-16 MED ORDER — IOHEXOL 300 MG/ML  SOLN
100.0000 mL | Freq: Once | INTRAMUSCULAR | Status: AC | PRN
  Administered 2022-06-16: 100 mL via INTRAVENOUS

## 2022-06-16 MED ORDER — LORAZEPAM 1 MG PO TABS
0.0000 mg | ORAL_TABLET | Freq: Four times a day (QID) | ORAL | Status: AC
  Administered 2022-06-17: 2 mg via ORAL
  Administered 2022-06-18: 4 mg via ORAL
  Filled 2022-06-16: qty 4
  Filled 2022-06-16: qty 2

## 2022-06-16 MED ORDER — DOCUSATE SODIUM 100 MG PO CAPS
100.0000 mg | ORAL_CAPSULE | Freq: Two times a day (BID) | ORAL | Status: DC
  Administered 2022-06-16 – 2022-06-20 (×8): 100 mg via ORAL
  Filled 2022-06-16 (×9): qty 1

## 2022-06-16 MED ORDER — KETOROLAC TROMETHAMINE 15 MG/ML IJ SOLN: 15.0000 mg | Freq: Four times a day (QID) | INTRAMUSCULAR | Status: DC | PRN

## 2022-06-16 MED ORDER — ENOXAPARIN SODIUM 30 MG/0.3ML IJ SOSY
30.0000 mg | PREFILLED_SYRINGE | Freq: Two times a day (BID) | INTRAMUSCULAR | Status: DC
  Administered 2022-06-16 – 2022-06-19 (×8): 30 mg via SUBCUTANEOUS
  Filled 2022-06-16 (×8): qty 0.3

## 2022-06-16 MED ORDER — SODIUM CHLORIDE 0.9 % IV BOLUS
1000.0000 mL | Freq: Once | INTRAVENOUS | Status: AC
  Administered 2022-06-16: 1000 mL via INTRAVENOUS

## 2022-06-16 MED ORDER — THIAMINE HCL 100 MG/ML IJ SOLN: 100.0000 mg | Freq: Every day | INTRAMUSCULAR | Status: DC

## 2022-06-16 MED ORDER — HYDRALAZINE HCL 20 MG/ML IJ SOLN
10.0000 mg | Freq: Four times a day (QID) | INTRAMUSCULAR | Status: DC | PRN
  Administered 2022-06-17: 10 mg via INTRAVENOUS
  Filled 2022-06-16: qty 1

## 2022-06-16 MED ORDER — GUAIFENESIN 200 MG PO TABS
200.0000 mg | ORAL_TABLET | Freq: Four times a day (QID) | ORAL | Status: DC
  Administered 2022-06-16 – 2022-06-20 (×12): 200 mg via ORAL
  Filled 2022-06-16 (×18): qty 1

## 2022-06-16 MED ORDER — LORAZEPAM 1 MG PO TABS: 1.0000 mg | ORAL_TABLET | Freq: Four times a day (QID) | ORAL | Status: DC

## 2022-06-16 MED ORDER — MORPHINE SULFATE (PF) 2 MG/ML IV SOLN
2.0000 mg | INTRAVENOUS | Status: DC | PRN
  Administered 2022-06-16 – 2022-06-17 (×2): 2 mg via INTRAVENOUS
  Filled 2022-06-16 (×2): qty 1

## 2022-06-16 MED ORDER — OXYCODONE HCL 5 MG PO TABS
5.0000 mg | ORAL_TABLET | ORAL | Status: DC | PRN
  Administered 2022-06-16 – 2022-06-19 (×6): 10 mg via ORAL
  Filled 2022-06-16 (×7): qty 2

## 2022-06-16 MED ORDER — FENTANYL CITRATE PF 50 MCG/ML IJ SOSY
50.0000 ug | PREFILLED_SYRINGE | INTRAMUSCULAR | Status: DC | PRN
  Administered 2022-06-16 (×2): 50 ug via INTRAVENOUS
  Filled 2022-06-16 (×2): qty 1

## 2022-06-16 MED ORDER — THIAMINE MONONITRATE 100 MG PO TABS
100.0000 mg | ORAL_TABLET | Freq: Every day | ORAL | Status: DC
  Administered 2022-06-16 – 2022-06-20 (×5): 100 mg via ORAL
  Filled 2022-06-16 (×5): qty 1

## 2022-06-16 MED ORDER — FENTANYL CITRATE PF 50 MCG/ML IJ SOSY: 50.0000 ug | PREFILLED_SYRINGE | INTRAMUSCULAR | Status: DC | PRN

## 2022-06-16 MED ORDER — MORPHINE SULFATE (PF) 4 MG/ML IV SOLN: 4.0000 mg | INTRAVENOUS | Status: DC | PRN

## 2022-06-16 MED ORDER — SODIUM CHLORIDE 0.9 % IV SOLN: INTRAVENOUS | Status: DC

## 2022-06-16 MED ORDER — ACETAMINOPHEN 500 MG PO TABS
1000.0000 mg | ORAL_TABLET | Freq: Four times a day (QID) | ORAL | Status: DC
  Administered 2022-06-16 – 2022-06-18 (×8): 1000 mg via ORAL
  Filled 2022-06-16 (×8): qty 2

## 2022-06-16 MED ORDER — LIDOCAINE 5 % EX PTCH
1.0000 | MEDICATED_PATCH | CUTANEOUS | Status: DC
  Administered 2022-06-16 – 2022-06-20 (×5): 1 via TRANSDERMAL
  Filled 2022-06-16 (×5): qty 1

## 2022-06-16 NOTE — ED Notes (Signed)
Report given to [facility]. 

## 2022-06-16 NOTE — ED Notes (Signed)
[facility] at bedside 

## 2022-06-16 NOTE — ED Notes (Signed)
Oxygen removed per EDP for trial. Oxygen dropped to 87% within a few minutes of no supplemental oxygen. NRB placed per EDP instruction. EDP aware of need for supplemental oxygen.

## 2022-06-16 NOTE — Progress Notes (Signed)
[facility]D resources added. At this time there are no other TOC needs.

## 2022-06-16 NOTE — ED Notes (Signed)
[facility] called for transport. 

## 2022-06-16 NOTE — ED Notes (Signed)
Patient placed on NRB for nitrogen washout due too a small pneumothorax, Patient tolerating well

## 2022-06-16 NOTE — Evaluation (Signed)
Physical Therapy Evaluation Patient Details Name: Jamie Campos MRN: [id number] DOB: 1970-06-11 Today's Date: 06/16/2022  History of Present Illness  Pt is a 52 y.o. M who presents 06/15/2022 after a ground level fall at home. Found to have multiple right rib fractures 7-11 with tiny pneumothorax and small pleural effusion. Significant PMH: none.  Clinical Impression  PTA, pt lives with his spouse and son and is self employed as a Administrator. PT evaluation limited by pt pain, despite max pre-medication prior to session. Education and pain control strategies reviewed regarding pillow splinting and log roll technique. Pt requiring heavy moderate assist for rolling towards left side slowly and bringing trunk up to sitting position. Despite significant encouragement, pt refusing to attempt standing or ambulating and requesting to lie back down. Desat to 88% on 2L O2; rebounded to 90% with cues for pursed lip breathing. Ordered incentive spirometer and reviewed use and importance of mobility. Pt demonstrates good strength in all four extremities; suspect once pain is well controlled and managed, will progress well.     Recommendations for follow up therapy are one component of a multi-disciplinary discharge planning process, led by the attending physician.  Recommendations may be updated based on patient status, additional functional criteria and insurance authorization.  Follow Up Recommendations Other (comment) (TBD; pending pain control, pt may not need follow up PT)      Assistance Recommended at Discharge PRN  Patient can return home with the following  A lot of help with walking and/or transfers;A lot of help with bathing/dressing/bathroom;Assist for transportation;Help with stairs or ramp for entrance    Equipment Recommendations Rolling walker (2 wheels);BSC/3in1 (may change pending further progress)  Recommendations for Other Services       Functional Status Assessment Patient has  had a recent decline in their functional status and demonstrates the ability to make significant improvements in function in a reasonable and predictable amount of time.     Precautions / Restrictions Precautions Precautions: Fall Restrictions Weight Bearing Restrictions: No      Mobility  Bed Mobility Overal bed mobility: Needs Assistance Bed Mobility: Rolling, Sidelying to Sit, Sit to Sidelying Rolling: Mod assist Sidelying to sit: Mod assist     Sit to sidelying: Min assist General bed mobility comments: Heavy moderate assist for rolling towards the left slowly with cues for pillow splinting on R flank and use of bed pad to guide hips into sidelying position. With increased time, pt able to work BLE's off edge of bed. Moderate assist to push up trunk to sitting position. Use of bed pad to pivot R hip anteriorly. MinA for LE assist back into bed    Transfers                   General transfer comment: deferred by pt    Ambulation/Gait                  Stairs            Wheelchair Mobility    Modified Rankin (Stroke Patients Only)       Balance Overall balance assessment: Needs assistance Sitting-balance support: Feet supported Sitting balance-Leahy Scale: Poor Sitting balance - Comments: reliant on single UE support due to pain                                     Pertinent Vitals/Pain Pain Assessment Pain Assessment:  Faces Faces Pain Scale: Hurts worst Pain Location: R flank Pain Descriptors / Indicators: Guarding, Grimacing, Sharp Pain Intervention(s): Limited activity within patient's tolerance, Monitored during session, Premedicated before session    [place] expects to be discharged to:: Private residence Living Arrangements: Spouse/significant other Available Help at Discharge: Family Type of Home: House Home Access: Stairs to enter Entrance Stairs-Rails: None Entrance Stairs-Number of Steps:  4 Alternate Level Stairs-Number of Steps: 13 Home Layout: Two level;1/2 bath on main level Home Equipment: None      Prior Function Prior Level of Function : Independent/Modified Independent;Working/employed             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        Extremity/Trunk Assessment   Upper Extremity Assessment Upper Extremity Assessment: Overall WFL for tasks assessed    Lower Extremity Assessment Lower Extremity Assessment: Overall WFL for tasks assessed    Cervical / Trunk Assessment Cervical / Trunk Assessment: Normal  Communication   Communication: No difficulties  Cognition Arousal/Alertness: Awake/alert Behavior During Therapy: WFL for tasks assessed/performed Overall Cognitive Status: Within Functional Limits for tasks assessed                                          General Comments      Exercises     Assessment/Plan    PT Assessment Patient needs continued PT services  PT Problem List Decreased activity tolerance;Decreased mobility;Pain;Cardiopulmonary status limiting activity       PT Treatment Interventions DME instruction;Gait training;Stair training;Functional mobility training;Therapeutic activities;Therapeutic exercise;Balance training;Patient/family education    PT Goals (Current goals can be found in the Care Plan section)  Acute Rehab PT Goals Patient Stated Goal: less pain PT Goal Formulation: With patient/family Time For Goal Achievement: 06/30/22 Potential to Achieve Goals: Good    Frequency Min 3X/week     Co-evaluation               AM-PAC PT "6 Clicks" Mobility  Outcome Measure Help needed turning from your back to your side while in a flat bed without using bedrails?: A Lot Help needed moving from lying on your back to sitting on the side of a flat bed without using bedrails?: A Lot Help needed moving to and from a bed to a chair (including a wheelchair)?: A  Lot Help needed standing up from a chair using your arms (e.g., wheelchair or bedside chair)?: A Lot Help needed to walk in hospital room?: A Little Help needed climbing 3-5 steps with a railing? : A Lot 6 Click Score: 13    End of Session Equipment Utilized During Treatment: Oxygen Activity Tolerance: Patient limited by pain Patient left: in bed;with call bell/phone within reach;with bed alarm set;with family/visitor present Nurse Communication: Mobility status;Patient requests pain meds PT Visit Diagnosis: Difficulty in walking, not elsewhere classified (R26.2);Pain Pain - Right/Left: Right Pain - part of body:  (flank)    Time: [id number] PT Time Calculation (min) (ACUTE ONLY): 24 min   Charges:   PT Evaluation $PT Eval Low Complexity: 1 Low PT Treatments $Therapeutic Activity: 8-22 mins        Wyona Almas, PT, DPT Acute Rehabilitation Services Office [phone]   Deno Etienne 06/16/2022, 3:50 PM

## 2022-06-16 NOTE — ED Notes (Signed)
Pt transported to CT ?

## 2022-06-16 NOTE — ED Provider Notes (Signed)
Nursing notes and vitals signs, including pulse oximetry, reviewed.  Summary of this visit's results, reviewed by myself:  EKG:  EKG Interpretation  Date/Time:    Ventricular Rate:    PR Interval:    QRS Duration:   QT Interval:    QTC Calculation:   R Axis:     Text Interpretation:          Labs:  Results for orders placed or performed during the hospital encounter of 06/15/22 (from the past 24 hour(s))  CBC with Differential     Status: Abnormal   Collection Time: 06/15/22 11:39 PM  Result Value Ref Range   WBC 9.3 4.0 - 10.5 K/uL   RBC 3.67 (L) 4.22 - 5.81 MIL/uL   Hemoglobin 12.8 (L) 13.0 - 17.0 g/dL   HCT 36.3 (L) 39.0 - 52.0 %   MCV 98.9 80.0 - 100.0 fL   MCH 34.9 (H) 26.0 - 34.0 pg   MCHC 35.3 30.0 - 36.0 g/dL   RDW 12.7 11.5 - 15.5 %   Platelets 172 150 - 400 K/uL   nRBC 0.0 0.0 - 0.2 %   Neutrophils Relative % 89 %   Neutro Abs 8.2 (H) 1.7 - 7.7 K/uL   Lymphocytes Relative 6 %   Lymphs Abs 0.5 (L) 0.7 - 4.0 K/uL   Monocytes Relative 5 %   Monocytes Absolute 0.5 0.1 - 1.0 K/uL   Eosinophils Relative 0 %   Eosinophils Absolute 0.0 0.0 - 0.5 K/uL   Basophils Relative 0 %   Basophils Absolute 0.0 0.0 - 0.1 K/uL   Immature Granulocytes 0 %   Abs Immature Granulocytes 0.04 0.00 - 0.07 K/uL  Comprehensive metabolic panel     Status: Abnormal   Collection Time: 06/15/22 11:39 PM  Result Value Ref Range   Sodium 138 135 - 145 mmol/L   Potassium 4.3 3.5 - 5.1 mmol/L   Chloride 102 98 - 111 mmol/L   CO2 23 22 - 32 mmol/L   Glucose, Bld 95 70 - 99 mg/dL   BUN 9 6 - 20 mg/dL   Creatinine, Ser 1.11 0.61 - 1.24 mg/dL   Calcium 8.3 (L) 8.9 - 10.3 mg/dL   Total Protein 8.0 6.5 - 8.1 g/dL   Albumin 3.8 3.5 - 5.0 g/dL   AST 524 (H) 15 - 41 U/L   ALT 202 (H) 0 - 44 U/L   Alkaline Phosphatase 87 38 - 126 U/L   Total Bilirubin 0.9 0.3 - 1.2 mg/dL   GFR, Estimated >60 >60 mL/min   Anion gap 13 5 - 15  Lipase, blood     Status: None   Collection Time: 06/15/22  11:39 PM  Result Value Ref Range   Lipase 44 11 - 51 U/L  Troponin I (High Sensitivity)     Status: None   Collection Time: 06/15/22 11:39 PM  Result Value Ref Range   Troponin I (High Sensitivity) 9 <18 ng/L  Urinalysis, Routine w reflex microscopic -Urine, Clean Catch     Status: Abnormal   Collection Time: 06/16/22 12:00 AM  Result Value Ref Range   Color, Urine YELLOW YELLOW   APPearance CLEAR CLEAR   Specific Gravity, Urine 1.010 1.005 - 1.030   pH 5.5 5.0 - 8.0   Glucose, UA NEGATIVE NEGATIVE mg/dL   Hgb urine dipstick MODERATE (A) NEGATIVE   Bilirubin Urine NEGATIVE NEGATIVE   Ketones, ur NEGATIVE NEGATIVE mg/dL   Protein, ur 30 (A) NEGATIVE mg/dL   Nitrite NEGATIVE  NEGATIVE   Leukocytes,Ua NEGATIVE NEGATIVE  Urinalysis, Microscopic (reflex)     Status: Abnormal   Collection Time: 06/16/22 12:00 AM  Result Value Ref Range   RBC / HPF 0-5 0 - 5 RBC/hpf   WBC, UA 0-5 0 - 5 WBC/hpf   Bacteria, UA RARE (A) NONE SEEN   Squamous Epithelial / HPF 0-5 0 - 5 /HPF  Ethanol     Status: Abnormal   Collection Time: 06/16/22 12:10 AM  Result Value Ref Range   Alcohol, Ethyl (B) 307 (HH) <10 mg/dL    Imaging Studies: CT CHEST ABDOMEN PELVIS W CONTRAST  Result Date: 06/16/2022 CLINICAL DATA:  Recent fall with right-sided chest pain and known right rib fractures EXAM: CT CHEST, ABDOMEN, AND PELVIS WITH CONTRAST TECHNIQUE: Multidetector CT imaging of the chest, abdomen and pelvis was performed following the standard protocol during bolus administration of intravenous contrast. RADIATION DOSE REDUCTION: This exam was performed according to the departmental dose-optimization program which includes automated exposure control, adjustment of the mA and/or kV according to patient size and/or use of iterative reconstruction technique. CONTRAST:  167mL OMNIPAQUE IOHEXOL 300 MG/ML  SOLN COMPARISON:  12/11/2016 FINDINGS: CT CHEST FINDINGS Cardiovascular: Atherosclerotic calcifications of the thoracic  aorta are noted. No aneurysmal dilatation or dissection is seen. The heart is enlarged in size. Pulmonary artery shows no pulmonary embolus although not timed for embolus evaluation. Mediastinum/Nodes: No enlarged mediastinal, hilar, or axillary lymph nodes. Thyroid gland, trachea, and esophagus demonstrate no significant findings. Lungs/Pleura: Lungs are well aerated bilaterally. Dependent atelectatic changes are noted bilaterally. Tiny basilar pneumothorax is noted on the right with associated small right-sided pleural effusion. Musculoskeletal: Multiple right-sided rib fractures are noted to include the anterior aspect of the right seventh and eighth ribs and the posterior aspects of the right eighth through eleventh ribs posteriorly. No left-sided rib fractures are seen. No compression fractures are noted. CT ABDOMEN PELVIS FINDINGS Hepatobiliary: Gallbladder is within normal limits. There is an area of geographic decreased attenuation in the liver posteriorly. This is improved from prior exam from 2018. This is consistent with focal fatty infiltration. No definitive hepatic injury is seen. Pancreas: Unremarkable. No pancreatic ductal dilatation or surrounding inflammatory changes. Spleen: Normal in size without focal abnormality. Adrenals/Urinary Tract: Adrenal glands are within normal limits. Kidneys are well visualize without renal calculi or obstructive changes. Stable right renal cyst is noted on changed from the prior exam. No further follow-up is recommended. Bladder is partially distended. Stomach/Bowel: No obstructive or inflammatory changes of the colon are noted. The appendix is within normal limits. Small bowel and stomach are unremarkable. Vascular/Lymphatic: Aortic atherosclerosis. No enlarged abdominal or pelvic lymph nodes. Reproductive: Prostate is unremarkable. Other: No abdominal wall hernia or abnormality. No abdominopelvic ascites. Musculoskeletal: No acute or significant osseous findings.  IMPRESSION: Multiple right-sided rib fractures as described with a tiny basilar pneumothorax Small right-sided pleural effusion. Dependent atelectatic changes are noted bilaterally. Geographic area of focal fatty infiltration in the posterior aspect of the right lobe of the liver. No other focal abnormality is noted. Electronically Signed   By: Inez Catalina M.D.   On: 06/16/2022 01:16   CT Head Wo Contrast  Result Date: 06/16/2022 CLINICAL DATA:  Fall EXAM: CT HEAD WITHOUT CONTRAST TECHNIQUE: Contiguous axial images were obtained from the base of the skull through the vertex without intravenous contrast. RADIATION DOSE REDUCTION: This exam was performed according to the departmental dose-optimization program which includes automated exposure control, adjustment of the mA and/or kV according  to patient size and/or use of iterative reconstruction technique. COMPARISON:  None Available. FINDINGS: Brain: No evidence of large-territorial acute infarction. No parenchymal hemorrhage. No mass lesion. No extra-axial collection. No mass effect or midline shift. No hydrocephalus. Basilar cisterns are patent. Vascular: No hyperdense vessel. Skull: No acute fracture or focal lesion. Sinuses/Orbits: Paranasal sinuses and mastoid air cells are clear. The orbits are unremarkable. Other: None. IMPRESSION: No acute intracranial abnormality. Electronically Signed   By: Iven Finn M.D.   On: 06/16/2022 01:09   DG Chest 2 View  Result Date: 06/15/2022 CLINICAL DATA:  [place] at home, with right-sided rib pain. EXAM: CHEST - 2 VIEW COMPARISON:  PA Lat 03/06/2014 FINDINGS: The cardiac size is upper limit of normal. No vascular congestion is seen. Age-advanced calcific plaque has increased in the transverse aorta with the mediastinum normally outlined. There is no visible pneumothorax.  Left lung is clear. On the right, there is patchy airspace disease in the lower lobe which is suspected probably due to pulmonary contusions.  Underlying pneumonia is difficult to exclude in the proper clinical setting. There are overlying mildly displaced fractures of the right posterior eighth and ninth ribs and of the posteromedial right tenth and eleventh ribs. IMPRESSION: 1. Mildly displaced fractures of the right posterior eighth and ninth ribs and of the posteromedial right tenth and eleventh ribs. No visible pneumothorax. 2. Patchy airspace disease in the right lower lobe is suspected to be pulmonary contusions. Underlying pneumonia is difficult to exclude in the proper clinical setting. 3. Increasing calcific plaque in the transverse aorta since 2015. Electronically Signed   By: Telford Nab M.D.   On: 06/15/2022 21:16    1:30 AM Patient is dropping his oxygen saturations to 87% on room air.  He has a small pneumothorax and small effusion on his chest CT.  We will place him on a nonrebreather to help resorb the pneumothorax and have him admitted to the trauma service at [facility].  1:46 AM Dr. Kieth Brightly accepts for admission to trauma progressive care at [facility].  CRITICAL CARE Performed by: Karen Chafe Georgean Spainhower Total critical care time: 30 minutes Critical care time was exclusive of separately billable procedures and treating other patients. Critical care was necessary to treat or prevent imminent or life-threatening deterioration. Critical care was time spent personally by me on the following activities: development of treatment plan with patient and/or surrogate as well as nursing, discussions with consultants, evaluation of patient's response to treatment, examination of patient, obtaining history from patient or surrogate, ordering and performing treatments and interventions, ordering and review of laboratory studies, ordering and review of radiographic studies, pulse oximetry and re-evaluation of patient's condition.     Dezi Schaner, Jenny Reichmann, MD 06/16/22 [phone]

## 2022-06-16 NOTE — Progress Notes (Signed)
OT Cancellation Note  Patient Details Name: Jamie Campos MRN: [id number] DOB: May 24, 1970   Cancelled Treatment:    Reason Eval/Treat Not Completed: Pain limiting ability to participate (Will reattempt OT evaluation as schedule permits)  Renaye Rakers, OTD, OTR/L [facility] [phone] - [place] 06/16/2022, 2:36 PM

## 2022-06-16 NOTE — ED Notes (Signed)
Update on patient given to receiving nurse on [facility]

## 2022-06-16 NOTE — H&P (Signed)
[facility] Surgery Admission Note  Jamie Campos 12/26/1970  [id number].    Requesting MD: Shanon Rosser Chief Complaint/Reason for Consult: fall  HPI:  Jamie Campos is a 52 y.o. male with no known PMH who presented to the ED last night after suffering a ground level fall at home. States that he tripped getting into the bathtub. He struck his right chest on the tub. Denies hitting his head or any LOC. Only complaining of right chest pain. Worse with movement and deep inspiration. Denies neck pain or pain in any extremity. Denies abdominal pain, nausea, vomiting. Has not ambulated since the incident. He does admit to drinking alcohol last night and Etoh found to be 307. Patient was worked up by [facility] and found to have Multiple Right rib fractures 7-11 with a tiny pneumothorax and a small pleural effusion.  Transferred to [facility] for admission to the trauma service.  Anticoagulants: none Smokes 1-1.5 PPD Drinks 6 beers and 6 shots about twice weekly Admits to 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 No pertinent family history.  Past Medical History:  Diagnosis Date   Acute pancreatitis     Past Surgical History:  Procedure Laterality Date   ROTATOR CUFF REPAIR      Social History:  reports that he has been smoking. He has been smoking an average of 1 pack per day. He has never used smokeless tobacco. He reports current alcohol use of about 3.0 standard drinks of alcohol per week. He reports that he does not use drugs.  Allergies: No Known Allergies  Medications Prior to Admission  Medication Sig Dispense Refill   HYDROcodone-acetaminophen (NORCO/VICODIN) 5-325 MG tablet Take 1 tablet by mouth every 6 (six) hours as needed for severe pain. 10 tablet 0   ondansetron (ZOFRAN ODT) 4 MG disintegrating tablet Take 1 tablet (4 mg  total) by mouth every 8 (eight) hours as needed for nausea or vomiting. 20 tablet 0   triamcinolone cream (KENALOG) 0.1 % Apply 1 application on to the affected area 2 times daily 15 g 0    Prior to Admission medications   Medication Sig Start Date End Date Taking? Authorizing Provider  HYDROcodone-acetaminophen (NORCO/VICODIN) 5-325 MG tablet Take 1 tablet by mouth every 6 (six) hours as needed for severe pain. 12/11/16   Avie Echevaria B, PA-C  ondansetron (ZOFRAN ODT) 4 MG disintegrating tablet Take 1 tablet (4 mg total) by mouth every 8 (eight) hours as needed for nausea or vomiting. 12/11/16   Avie Echevaria B, PA-C  triamcinolone cream (KENALOG) 0.1 % Apply 1 application on to the affected area 2 times daily 10/23/20       Blood pressure (!) 170/86, pulse 89, temperature 97.8 F (36.6 C), temperature source Oral, resp. rate 19, height 5\' 8"  (1.727 m), weight 81.6 kg, SpO2 97 %. Physical Exam: General: pleasant, WD/WN male who is laying in bed in NAD HEENT: no c-spine tenderness. head is normocephalic, atraumatic.  Sclera are noninjected.  Pupils equal and round and reactive to light.  Ears and nose without any masses or lesions.  Mouth is pink and moist. Dentition fair Heart: regular, rate, and rhythm Lungs: No wheezes or rales. Rhonchi present bilaterally. Respiratory effort nonlabored on 2L  Abd: soft, NT/ND, +BS, no masses, hernias, or organomegaly MS: no BUE/BLE edema, calves soft  and nontender. No gross deformity noted to BUE/BLE Skin: warm and dry with no masses, lesions, or rashes Psych: A&Ox4 with an appropriate affect Neuro: MAEs, no gross motor or sensory deficits BUE/BLE  Results for orders placed or performed during the hospital encounter of 06/15/22 (from the past 48 hour(s))  CBC with Differential     Status: Abnormal   Collection Time: 06/15/22 11:39 PM  Result Value Ref Range   WBC 9.3 4.0 - 10.5 K/uL   RBC 3.67 (L) 4.22 - 5.81 MIL/uL   Hemoglobin 12.8 (L) 13.0  - 17.0 g/dL   HCT 36.3 (L) 39.0 - 52.0 %   MCV 98.9 80.0 - 100.0 fL   MCH 34.9 (H) 26.0 - 34.0 pg   MCHC 35.3 30.0 - 36.0 g/dL   RDW 12.7 11.5 - 15.5 %   Platelets 172 150 - 400 K/uL   nRBC 0.0 0.0 - 0.2 %   Neutrophils Relative % 89 %   Neutro Abs 8.2 (H) 1.7 - 7.7 K/uL   Lymphocytes Relative 6 %   Lymphs Abs 0.5 (L) 0.7 - 4.0 K/uL   Monocytes Relative 5 %   Monocytes Absolute 0.5 0.1 - 1.0 K/uL   Eosinophils Relative 0 %   Eosinophils Absolute 0.0 0.0 - 0.5 K/uL   Basophils Relative 0 %   Basophils Absolute 0.0 0.0 - 0.1 K/uL   Immature Granulocytes 0 %   Abs Immature Granulocytes 0.04 0.00 - 0.07 K/uL    Comment: Performed at [facility], [address] [place], [place] [address]  Comprehensive metabolic panel     Status: Abnormal   Collection Time: 06/15/22 11:39 PM  Result Value Ref Range   Sodium 138 135 - 145 mmol/L   Potassium 4.3 3.5 - 5.1 mmol/L   Chloride 102 98 - 111 mmol/L   CO2 23 22 - 32 mmol/L   Glucose, Bld 95 70 - 99 mg/dL    Comment: Glucose reference range applies only to samples taken after fasting for at least 8 hours.   BUN 9 6 - 20 mg/dL   Creatinine, Ser 1.11 0.61 - 1.24 mg/dL   Calcium 8.3 (L) 8.9 - 10.3 mg/dL   Total Protein 8.0 6.5 - 8.1 g/dL   Albumin 3.8 3.5 - 5.0 g/dL   AST 524 (H) 15 - 41 U/L   ALT 202 (H) 0 - 44 U/L   Alkaline Phosphatase 87 38 - 126 U/L   Total Bilirubin 0.9 0.3 - 1.2 mg/dL   GFR, Estimated >60 >60 mL/min    Comment: (NOTE) Calculated using the CKD-EPI Creatinine Equation (2021)    Anion gap 13 5 - 15    Comment: Performed at [facility], [address] [place], [place] [address]  Lipase, blood     Status: None   Collection Time: 06/15/22 11:39 PM  Result Value Ref Range   Lipase 44 11 - 51 U/L    Comment: Performed at [facility], [address] [place], [place] [address]  Troponin I (High Sensitivity)     Status: None   Collection Time: 06/15/22 11:39 PM  Result Value Ref  Range   Troponin I (High Sensitivity) 9 <18 ng/L    Comment: (NOTE) Elevated high sensitivity troponin I (hsTnI) values and significant  changes across serial measurements may suggest ACS but many other  chronic and acute conditions are known to elevate hsTnI results.  Refer to the "Links" section for  chest pain algorithms and additional  guidance. Performed at [facility], [address] [place], [place] [address]   Urinalysis, Routine w reflex microscopic -Urine, Clean Catch     Status: Abnormal   Collection Time: 06/16/22 12:00 AM  Result Value Ref Range   Color, Urine YELLOW YELLOW   APPearance CLEAR CLEAR   Specific Gravity, Urine 1.010 1.005 - 1.030   pH 5.5 5.0 - 8.0   Glucose, UA NEGATIVE NEGATIVE mg/dL   Hgb urine dipstick MODERATE (A) NEGATIVE   Bilirubin Urine NEGATIVE NEGATIVE   Ketones, ur NEGATIVE NEGATIVE mg/dL   Protein, ur 30 (A) NEGATIVE mg/dL   Nitrite NEGATIVE NEGATIVE   Leukocytes,Ua NEGATIVE NEGATIVE    Comment: Performed at [facility], [address] [place], [place] [address]  Urinalysis, Microscopic (reflex)     Status: Abnormal   Collection Time: 06/16/22 12:00 AM  Result Value Ref Range   RBC / HPF 0-5 0 - 5 RBC/hpf   WBC, UA 0-5 0 - 5 WBC/hpf   Bacteria, UA RARE (A) NONE SEEN   Squamous Epithelial / HPF 0-5 0 - 5 /HPF    Comment: Performed at [facility], [address] [place], [place] [address]  Ethanol     Status: Abnormal   Collection Time: 06/16/22 12:10 AM  Result Value Ref Range   Alcohol, Ethyl (B) 307 (HH) <10 mg/dL    Comment: CRITICAL RESULT CALLED TO, READ BACK BY AND VERIFIED WITH A. MAYHEW,RN [id number] 06/16/2022 T. TYSOR (NOTE) Lowest detectable limit for serum alcohol is 10 mg/dL.  For medical purposes only. Performed at [facility], [address] [place], [place] [address]   Protime-INR     Status: None   Collection Time: 06/16/22  1:52 AM  Result Value Ref Range    Prothrombin Time 13.2 11.4 - 15.2 seconds   INR 1.0 0.8 - 1.2    Comment: (NOTE) INR goal varies based on device and disease states. Performed at [facility], [address] [place], [place] [address]   APTT     Status: None   Collection Time: 06/16/22  1:52 AM  Result Value Ref Range   aPTT 24 24 - 36 seconds    Comment: Performed at [facility], [address] [place], [place] [address]  Troponin I (High Sensitivity)     Status: None   Collection Time: 06/16/22  1:53 AM  Result Value Ref Range   Troponin I (High Sensitivity) 11 <18 ng/L    Comment: (NOTE) Elevated high sensitivity troponin I (hsTnI) values and significant  changes across serial measurements may suggest ACS but many other  chronic and acute conditions are known to elevate hsTnI results.  Refer to the "Links" section for chest pain algorithms and additional  guidance. Performed at [facility], [address] [place], [place] [address]    CT CHEST ABDOMEN PELVIS W CONTRAST  Result Date: 06/16/2022 CLINICAL DATA:  Recent fall with right-sided chest pain and known right rib fractures EXAM: CT CHEST, ABDOMEN, AND PELVIS WITH CONTRAST TECHNIQUE: Multidetector CT imaging of the chest, abdomen and pelvis was performed following the standard protocol during bolus administration of intravenous contrast. RADIATION DOSE REDUCTION: This exam was performed according to the departmental dose-optimization program which includes automated exposure control, adjustment of the mA and/or kV according to patient size and/or use of iterative reconstruction technique. CONTRAST:  12mL OMNIPAQUE IOHEXOL 300 MG/ML  SOLN COMPARISON:  12/11/2016 FINDINGS: CT CHEST FINDINGS Cardiovascular: Atherosclerotic calcifications of the thoracic aorta are noted. No aneurysmal dilatation or dissection is seen. The heart is enlarged in size. Pulmonary artery shows no pulmonary embolus although not timed for embolus  evaluation. Mediastinum/Nodes: No enlarged mediastinal, hilar, or axillary lymph nodes. Thyroid gland, trachea, and esophagus demonstrate no significant findings. Lungs/Pleura: Lungs are well aerated bilaterally. Dependent atelectatic changes are noted bilaterally. Tiny basilar pneumothorax is noted on the right with associated small right-sided pleural effusion. Musculoskeletal: Multiple right-sided rib fractures are noted to include the anterior aspect of the right seventh and eighth ribs and the posterior aspects of the right eighth through eleventh ribs posteriorly. No left-sided rib fractures are seen. No compression fractures are noted. CT ABDOMEN PELVIS FINDINGS Hepatobiliary: Gallbladder is within normal limits. There is an area of geographic decreased attenuation in the liver posteriorly. This is improved from prior exam from 2018. This is consistent with focal fatty infiltration. No definitive hepatic injury is seen. Pancreas: Unremarkable. No pancreatic ductal dilatation or surrounding inflammatory changes. Spleen: Normal in size without focal abnormality. Adrenals/Urinary Tract: Adrenal glands are within normal limits. Kidneys are well visualize without renal calculi or obstructive changes. Stable right renal cyst is noted on changed from the prior exam. No further follow-up is recommended. Bladder is partially distended. Stomach/Bowel: No obstructive or inflammatory changes of the colon are noted. The appendix is within normal limits. Small bowel and stomach are unremarkable. Vascular/Lymphatic: Aortic atherosclerosis. No enlarged abdominal or pelvic lymph nodes. Reproductive: Prostate is unremarkable. Other: No abdominal wall hernia or abnormality. No abdominopelvic ascites. Musculoskeletal: No acute or significant osseous findings. IMPRESSION: Multiple right-sided rib fractures as described with a tiny basilar pneumothorax Small right-sided pleural effusion. Dependent atelectatic changes are noted  bilaterally. Geographic area of focal fatty infiltration in the posterior aspect of the right lobe of the liver. No other focal abnormality is noted. Electronically Signed   By: Inez Catalina M.D.   On: 06/16/2022 01:16   CT Head Wo Contrast  Result Date: 06/16/2022 CLINICAL DATA:  Fall EXAM: CT HEAD WITHOUT CONTRAST TECHNIQUE: Contiguous axial images were obtained from the base of the skull through the vertex without intravenous contrast. RADIATION DOSE REDUCTION: This exam was performed according to the departmental dose-optimization program which includes automated exposure control, adjustment of the mA and/or kV according to patient size and/or use of iterative reconstruction technique. COMPARISON:  None Available. FINDINGS: Brain: No evidence of large-territorial acute infarction. No parenchymal hemorrhage. No mass lesion. No extra-axial collection. No mass effect or midline shift. No hydrocephalus. Basilar cisterns are patent. Vascular: No hyperdense vessel. Skull: No acute fracture or focal lesion. Sinuses/Orbits: Paranasal sinuses and mastoid air cells are clear. The orbits are unremarkable. Other: None. IMPRESSION: No acute intracranial abnormality. Electronically Signed   By: Iven Finn M.D.   On: 06/16/2022 01:09   DG Chest 2 View  Result Date: 06/15/2022 CLINICAL DATA:  [place] at home, with right-sided rib pain. EXAM: CHEST - 2 VIEW COMPARISON:  PA Lat 03/06/2014 FINDINGS: The cardiac size is upper limit of normal. No vascular congestion is seen. Age-advanced calcific plaque has increased in the transverse aorta with the mediastinum normally outlined. There is no visible pneumothorax.  Left lung is clear. On the right, there is patchy airspace disease in the lower lobe which is suspected probably due to pulmonary contusions. Underlying pneumonia is difficult to exclude in the proper clinical setting. There are overlying mildly  displaced fractures of the right posterior eighth and ninth ribs and  of the posteromedial right tenth and eleventh ribs. IMPRESSION: 1. Mildly displaced fractures of the right posterior eighth and ninth ribs and of the posteromedial right tenth and eleventh ribs. No visible pneumothorax. 2. Patchy airspace disease in the right lower lobe is suspected to be pulmonary contusions. Underlying pneumonia is difficult to exclude in the proper clinical setting. 3. Increasing calcific plaque in the transverse aorta since 2015. Electronically Signed   By: Telford Nab M.D.   On: 06/15/2022 21:16      Assessment/Plan Fall Multiple R rib fxs 7-11 with tiny PTX, small pleural effusion - multimodal pain control and pulm toilet. Repeat CXR this AM. Wean to room air as able.  Elevated transaminases - no liver injury noted on CT and no abdominal pain. Repeat CMP in AM Tobacco abuse - nicotine patch. encouraged cessation Alcohol abuse - Etoh 307. Drinks 6 beers and 6 shots about twice weekly, no h/o withdrawal. CIWA. TOC consult for CAGE-AID Elevated blood pressure - not on any antihypertensives prior to arrival, but does not see PCP regularly. Could be due to pain vs underlying HTN. Will monitor and use IV hydralazine PRN for now.   ID - none VTE - SCDs, lovenox FEN - IVF@50cc /hr, reg diet Foley - none Dispo - Admit to progressive floor for observation. PT/OT.  I reviewed ED provider notes, last 24 h vitals and pain scores, last 48 h intake and output, last 24 h labs and trends, and last 24 h imaging results.  [facility] 06/16/2022, 9:30 AM Please see Amion for pager number during day hours 7:00am-4:30pm

## 2022-06-17 ENCOUNTER — Inpatient Hospital Stay (HOSPITAL_COMMUNITY): Payer: Managed Care, Other (non HMO)

## 2022-06-17 LAB — COMPREHENSIVE METABOLIC PANEL
ALT: 143 U/L — ABNORMAL HIGH (ref 0–44)
AST: 217 U/L — ABNORMAL HIGH (ref 15–41)
Albumin: 3.1 g/dL — ABNORMAL LOW (ref 3.5–5.0)
Alkaline Phosphatase: 69 U/L (ref 38–126)
Anion gap: 8 (ref 5–15)
BUN: 6 mg/dL (ref 6–20)
CO2: 26 mmol/L (ref 22–32)
Calcium: 8.1 mg/dL — ABNORMAL LOW (ref 8.9–10.3)
Chloride: 97 mmol/L — ABNORMAL LOW (ref 98–111)
Creatinine, Ser: 0.95 mg/dL (ref 0.61–1.24)
GFR, Estimated: >60 mL/min (ref >60)
Glucose, Bld: 101 mg/dL — ABNORMAL HIGH (ref 70–99)
Potassium: 3.3 mmol/L — ABNORMAL LOW (ref 3.5–5.1)
Sodium: 131 mmol/L — ABNORMAL LOW (ref 135–145)
Total Bilirubin: 1.7 mg/dL — ABNORMAL HIGH (ref 0.3–1.2)
Total Protein: 7 g/dL (ref 6.5–8.1)

## 2022-06-17 LAB — CBC
HCT: 33.4 % — ABNORMAL LOW (ref 39.0–52.0)
Hemoglobin: 11.9 g/dL — ABNORMAL LOW (ref 13.0–17.0)
MCH: 34.8 pg — ABNORMAL HIGH (ref 26.0–34.0)
MCHC: 35.6 g/dL (ref 30.0–36.0)
MCV: 97.7 fL (ref 80.0–100.0)
Platelets: 151 10*3/uL (ref 150–400)
RBC: 3.42 MIL/uL — ABNORMAL LOW (ref 4.22–5.81)
RDW: 12.4 % (ref 11.5–15.5)
WBC: 4.5 10*3/uL (ref 4.0–10.5)
nRBC: 0 % (ref 0.0–0.2)

## 2022-06-17 MED ORDER — METHOCARBAMOL 500 MG PO TABS
750.0000 mg | ORAL_TABLET | Freq: Four times a day (QID) | ORAL | Status: DC
  Administered 2022-06-17 – 2022-06-20 (×12): 750 mg via ORAL
  Filled 2022-06-17 (×12): qty 2

## 2022-06-17 MED ORDER — AMLODIPINE BESYLATE 5 MG PO TABS
5.0000 mg | ORAL_TABLET | Freq: Every day | ORAL | Status: DC
  Administered 2022-06-17 – 2022-06-20 (×4): 5 mg via ORAL
  Filled 2022-06-17 (×4): qty 1

## 2022-06-17 MED ORDER — POTASSIUM CHLORIDE CRYS ER 20 MEQ PO TBCR
40.0000 meq | EXTENDED_RELEASE_TABLET | Freq: Two times a day (BID) | ORAL | Status: AC
  Administered 2022-06-17 (×2): 40 meq via ORAL
  Filled 2022-06-17 (×2): qty 2

## 2022-06-17 MED ORDER — IPRATROPIUM-ALBUTEROL 0.5-2.5 (3) MG/3ML IN SOLN
3.0000 mL | Freq: Four times a day (QID) | RESPIRATORY_TRACT | Status: AC
  Administered 2022-06-17: 3 mL via RESPIRATORY_TRACT
  Filled 2022-06-17: qty 3

## 2022-06-17 MED ORDER — KETOROLAC TROMETHAMINE 15 MG/ML IJ SOLN
15.0000 mg | Freq: Four times a day (QID) | INTRAMUSCULAR | Status: DC
  Administered 2022-06-17 – 2022-06-20 (×12): 15 mg via INTRAVENOUS
  Filled 2022-06-17 (×12): qty 1

## 2022-06-17 MED ORDER — SODIUM CHLORIDE 1 G PO TABS
1.0000 g | ORAL_TABLET | Freq: Three times a day (TID) | ORAL | Status: DC
  Administered 2022-06-17 – 2022-06-20 (×9): 1 g via ORAL
  Filled 2022-06-17 (×9): qty 1

## 2022-06-17 NOTE — Plan of Care (Signed)

## 2022-06-17 NOTE — TOC Initial Note (Signed)
Transition of Care [facility]) - Initial/Assessment Note    Patient Details  Name: Jamie Campos MRN: [id number] Date of Birth: [birth year]  Transition of Care [facility]) CM/SW Contact:    Ella Bodo, RN Phone Number: 06/17/2022, 3:50 PM  Clinical Narrative:                  Pt is a 52 y.o. M who presents 06/15/2022 after a ground level fall at home. Found to have multiple right rib fractures 7-11 with tiny pneumothorax and small pleural effusion.  PTA, pt independent and living at home with spouse and son, who can provide needed assistance at discharge. PT/OT recommending HH follow up at discharge; may need home oxygen, based on ambulation with PT/OT today.  Patient's sats dropped into the 70s with standing, and required 6L of oxygen to keep sats 92%.  Will follow for home needs as patient progresses.     Expected Discharge Plan: Home/Self Care Barriers to Discharge: Continued Medical Work up   Patient Goals and CMS Choice Patient states their goals for this hospitalization and ongoing recovery are:: to go home          Expected Discharge Plan and Services   Discharge Planning Services: CM Consult   Living arrangements for the past 2 months: Single Family Home                                      Prior Living Arrangements/Services Living arrangements for the past 2 months: Single Family Home Lives with:: Spouse Patient language and need for interpreter reviewed:: Yes Do you feel safe going back to the place where you live?: Yes      Need for Family Participation in Patient Care: Yes (Comment) Care giver support system in place?: Yes (comment)   Criminal Activity/Legal Involvement Pertinent to Current Situation/Hospitalization: No - Comment as needed  Activities of Daily Living Home Assistive Devices/Equipment: None ADL Screening (condition at time of admission) Patient's cognitive ability adequate to safely complete daily activities?: Yes Is the patient deaf or  have difficulty hearing?: No Does the patient have difficulty seeing, even when wearing glasses/contacts?: No Does the patient have difficulty concentrating, remembering, or making decisions?: No Patient able to express need for assistance with ADLs?: Yes Does the patient have difficulty dressing or bathing?: No Independently performs ADLs?: Yes (appropriate for developmental age) Does the patient have difficulty walking or climbing stairs?: No Weakness of Legs: None Weakness of Arms/Hands: None  Permission Sought/Granted                  Emotional Assessment Appearance:: Appears stated age Attitude/Demeanor/Rapport: Engaged Affect (typically observed): Accepting Orientation: : Oriented to Self, Oriented to Place, Oriented to  Time, Oriented to Situation Alcohol / Substance Use: Alcohol Use    Admission diagnosis:  Hypoxia [R09.02] Pleural effusion on right [J90] Multiple rib fractures [S22.49XA] Traumatic pneumothorax, initial encounter [S27.0XXA] Fall in home, initial encounter [W19.XXXA, Y92.009] Closed fracture of multiple ribs of right side, initial encounter [id number] Alcoholic hepatitis, unspecified whether ascites present [K70.10] Patient Active Problem List   Diagnosis Date Noted   Multiple rib fractures 06/16/2022   PCP:  Shirline Frees, MD Pharmacy:   [facility] [address], [place] [address] Phone: [phone] Fax: [phone]     Social Determinants of Health (SDOH) Social History: SDOH Screenings  Food Insecurity: No Food Insecurity (06/17/2022)  Housing: Low Risk  (06/17/2022)  Transportation Needs: No Transportation Needs (06/17/2022)  Utilities: Not At Risk (06/17/2022)  Tobacco Use: High Risk (06/16/2022)   SDOH Interventions:     Readmission Risk Interventions     No data to display         Reinaldo Raddle, RN, BSN  Trauma/Neuro ICU Case Manager [phone]

## 2022-06-17 NOTE — Progress Notes (Signed)
Physical Therapy Treatment Patient Details Name: Jamie Campos MRN: [id number] DOB: 1970-11-28 Today's Date: 06/17/2022   History of Present Illness Pt is a 52 y.o. M who presents 06/15/2022 after a ground level fall at home. Found to have multiple right rib fractures 7-11 with tiny pneumothorax and small pleural effusion. Significant PMH: none.    PT Comments    Pt received in supine, agreeable to therapy session with emphasis on transfer and gait training. Pt hypoxic on 3L O2 [place] with exertion, SpO2 increased to 87% on 4L O2  however HR elevated to 150-170 bpm so O2 increased to 6L/min and HR decreased to 120's with SpO2 increase to 92%, VS remained within this range for household distance gait trial in hallway. Disposition updated per discussion with supervising PT Chrys Racer B and updated below. Pt continues to benefit from PT services to progress toward functional mobility goals.    Recommendations for follow up therapy are one component of a multi-disciplinary discharge planning process, led by the attending physician.  Recommendations may be updated based on patient status, additional functional criteria and insurance authorization.  Follow Up Recommendations   HHPT (pending pain control/progression)    Assistance Recommended at Discharge PRN  Patient can return home with the following A lot of help with walking and/or transfers;A lot of help with bathing/dressing/bathroom;Assist for transportation;Help with stairs or ramp for entrance   Equipment Recommendations  Rolling walker (2 wheels);BSC/3in1 (likely to need supplemental O2; recs pending progress)    Recommendations for Other Services       Precautions / Restrictions Precautions Precautions: Fall Precaution Comments: hypoxic without supplemental O2, tachycardia Restrictions Weight Bearing Restrictions: No     Mobility  Bed Mobility Overal bed mobility: Needs Assistance Bed Mobility: Sit to Supine, Rolling,  Sidelying to Sit Rolling: Supervision Sidelying to sit: Min guard   Sit to supine: HOB elevated, Min guard   General bed mobility comments: Cues for pillow splinting on R flank and Supervision/min guard along with increased time, pt able to work BLE's off edge of bed. Min guard for safety with return to supine, pt instead rotating hips and one at a time lifting LE onto bed, in long sit posture prior to returning trunk to supine. Pt reports this technique is less painful than log rolling, but at home he may not have elevating HOB so will need to practice from a flat bed next session.    Transfers Overall transfer level: Needs assistance Equipment used: Rolling walker (2 wheels) Transfers: Sit to/from Stand Sit to Stand: Min guard, Supervision           General transfer comment: Increased time needed. Pt reports the RW helped with balance and pain management; to/from [facility] and EOB with RW. Supervision only standing from [facility] and sitting onto EOB final rep.    Ambulation/Gait Ambulation/Gait assistance: Supervision Gait Distance (Feet): 160 Feet (77ft in room, then 164ft) Assistive device: Rolling walker (2 wheels) Gait Pattern/deviations: Step-through pattern       General Gait Details: only light BUE reliance on RW, pt with good standing balance using RW and fair pace, speed limited due to elevated RR with exertion and need for increased O2, emphasis on activity pacing/standing rest breaks intermittently.   Stairs             Wheelchair Mobility    Modified Rankin (Stroke Patients Only)       Balance Overall balance assessment: Needs assistance Sitting-balance support: Feet supported Sitting balance-Leahy Scale: Fair  Standing balance support: Bilateral upper extremity supported, During functional activity Standing balance-Leahy Scale: Fair Standing balance comment: pt washing hands unsupported no LOB, but steadier and with less pain using RW                             Cognition Arousal/Alertness: Awake/alert Behavior During Therapy: WFL for tasks assessed/performed Overall Cognitive Status: Within Functional Limits for tasks assessed                                 General Comments: decreased awareness of need to stay on supplemental O2, PTA reviewed how his HR increased and SpO2 desaturated when he wasn't on appropriate amount of oxygen once he was exerting himself and the effects that has on his heart, pt was receptive to info that was given. RN also notified.        Exercises Other Exercises Other Exercises: IS ~735mL x1 rep, pt defers more due to pain    General Comments General comments (skin integrity, edema, etc.): Pt SpO2 reading 76% on 3L O2 [place] standing at bedside initially (had been 89% resting in supine on 3L), SpO2 increased to 4L and reading 83-87%, HR increased to 150-170 bpm after pt stood on bathroom, so SpO2 increased to 6L and HR decreased to 120's bpm for remainder of standing/gait tasks. SpO2 92% on 6L O2 [place]. Pt placed back on 3L O2 [place] once returned to supine, RN notified.      Pertinent Vitals/Pain Pain Assessment Pain Assessment: Faces Faces Pain Scale: Hurts even more Pain Location: R lower ribs Pain Descriptors / Indicators: Guarding, Grimacing, Sharp Pain Intervention(s): Limited activity within patient's tolerance, Monitored during session, Repositioned, Patient requesting pain meds-RN notified     PT Goals (current goals can now be found in the care plan section) Acute Rehab PT Goals Patient Stated Goal: less pain PT Goal Formulation: With patient/family Time For Goal Achievement: 06/30/22 Progress towards PT goals: Progressing toward goals    Frequency    Min 3X/week      PT Plan Current plan remains appropriate    Co-evaluation              AM-PAC PT "6 Clicks" Mobility   Outcome Measure  Help needed turning from your back to your side while in a flat bed without  using bedrails?: A Little Help needed moving from lying on your back to sitting on the side of a flat bed without using bedrails?: A Little Help needed moving to and from a bed to a chair (including a wheelchair)?: A Little Help needed standing up from a chair using your arms (e.g., wheelchair or bedside chair)?: A Little Help needed to walk in hospital room?: A Little Help needed climbing 3-5 steps with a railing? : Total 6 Click Score: 16    End of Session Equipment Utilized During Treatment: Oxygen Activity Tolerance: Treatment limited secondary to medical complications (Comment);Other (comment);Patient tolerated treatment well (hypoxia on exertion, needed SpO2 increase to 6L/min to maintain WFL, tachy on 3-4L O2 with standing) Patient left: in bed;with call bell/phone within reach;with bed alarm set Nurse Communication: Mobility status;Patient requests pain meds;Other (comment) (tachy and hypoxic until he was placed on 6L O2 [place] with exertional tasks) PT Visit Diagnosis: Difficulty in walking, not elsewhere classified (R26.2);Pain Pain - Right/Left: Right Pain - part of body:  (ribcage)  Time: [id number] PT Time Calculation (min) (ACUTE ONLY): 36 min  Charges:  $Gait Training: 8-22 mins $Therapeutic Activity: 8-22 mins                     Carrina Schoenberger P., PTA Acute Rehabilitation Services Secure Chat Preferred 9a-5:30pm Office: [place] 06/17/2022, 3:33 PM

## 2022-06-17 NOTE — Progress Notes (Signed)
[facility] Progress Note     Subjective: CC-  Continues to complain of rib fracture pain but it is more manageable than yesterday. On 3L O2. Pulling 1000 on IS. Denies noting any new injuries.  Objective: Vital signs in last 24 hours: Temp:  [97.6 F (36.4 C)-98.4 F (36.9 C)] 97.8 F (36.6 C) (03/25 0739) Pulse Rate:  [62-90] 62 (03/25 0739) Resp:  [14-19] 14 (03/25 0739) BP: (157-173)/(81-95) 159/93 (03/25 0739) SpO2:  [90 %-97 %] 96 % (03/25 0739) Last BM Date : 06/15/22  Intake/Output from previous day: 03/24 0701 - 03/25 0700 In: 1841.9 [I.V.:841.7; IV Piggyback:1000.2] Out: 2100 [Urine:2100] Intake/Output this shift: Total I/O In: -  Out: 250 [Urine:250]  PE: Gen:  Alert, NAD, pleasant Card:  RRR Pulm:  CTAB, no W/R/R, rate and effort normal on 3L [place]: Soft, NT/ND Psych: A&Ox4   Lab Results:  Recent Labs    06/15/22 2339 06/17/22 0125  WBC 9.3 4.5  HGB 12.8* 11.9*  HCT 36.3* 33.4*  PLT 172 151   BMET Recent Labs    06/15/22 2339 06/17/22 0125  NA 138 131*  K 4.3 3.3*  CL 102 97*  CO2 23 26  GLUCOSE 95 101*  BUN 9 6  CREATININE 1.11 0.95  CALCIUM 8.3* 8.1*   PT/INR Recent Labs    06/16/22 0152  LABPROT 13.2  INR 1.0   CMP     Component Value Date/Time   NA 131 (L) 06/17/2022 0125   K 3.3 (L) 06/17/2022 0125   CL 97 (L) 06/17/2022 0125   CO2 26 06/17/2022 0125   GLUCOSE 101 (H) 06/17/2022 0125   BUN 6 06/17/2022 0125   CREATININE 0.95 06/17/2022 0125   CALCIUM 8.1 (L) 06/17/2022 0125   PROT 7.0 06/17/2022 0125   ALBUMIN 3.1 (L) 06/17/2022 0125   AST 217 (H) 06/17/2022 0125   ALT 143 (H) 06/17/2022 0125   ALKPHOS 69 06/17/2022 0125   BILITOT 1.7 (H) 06/17/2022 0125   GFRNONAA >60 06/17/2022 0125   GFRAA >60 12/11/2016 1620   Lipase     Component Value Date/Time   LIPASE 44 06/15/2022 2339       Studies/Results: DG CHEST PORT 1 VIEW  Result Date: 06/16/2022 CLINICAL DATA:  [id number] Multiple fractures  of ribs, right side, initial encounter for closed fracture [id number] EXAM: PORTABLE CHEST - 1 VIEW COMPARISON:  the previous day's study FINDINGS: Infrahilar consolidation/atelectasis with air bronchograms, left greater than right. The interstitial opacities seen previously have improved. Heart size and mediastinal contours are within normal limits. Aortic Atherosclerosis (ICD10-170.0). No effusion.  No pneumothorax. Minimally displaced lower right rib fractures again noted. IMPRESSION: 1. Infrahilar atelectasis/consolidation, left greater than right. 2. Right rib fractures without pneumothorax. Electronically Signed   By: Lucrezia Europe M.D.   On: 06/16/2022 10:42   CT CHEST ABDOMEN PELVIS W CONTRAST  Result Date: 06/16/2022 CLINICAL DATA:  Recent fall with right-sided chest pain and known right rib fractures EXAM: CT CHEST, ABDOMEN, AND PELVIS WITH CONTRAST TECHNIQUE: Multidetector CT imaging of the chest, abdomen and pelvis was performed following the standard protocol during bolus administration of intravenous contrast. RADIATION DOSE REDUCTION: This exam was performed according to the departmental dose-optimization program which includes automated exposure control, adjustment of the mA and/or kV according to patient size and/or use of iterative reconstruction technique. CONTRAST:  176mL OMNIPAQUE IOHEXOL 300 MG/ML  SOLN COMPARISON:  12/11/2016 FINDINGS: CT CHEST FINDINGS Cardiovascular: Atherosclerotic calcifications of the thoracic aorta are noted.  No aneurysmal dilatation or dissection is seen. The heart is enlarged in size. Pulmonary artery shows no pulmonary embolus although not timed for embolus evaluation. Mediastinum/Nodes: No enlarged mediastinal, hilar, or axillary lymph nodes. Thyroid gland, trachea, and esophagus demonstrate no significant findings. Lungs/Pleura: Lungs are well aerated bilaterally. Dependent atelectatic changes are noted bilaterally. Tiny basilar pneumothorax is noted on the right with  associated small right-sided pleural effusion. Musculoskeletal: Multiple right-sided rib fractures are noted to include the anterior aspect of the right seventh and eighth ribs and the posterior aspects of the right eighth through eleventh ribs posteriorly. No left-sided rib fractures are seen. No compression fractures are noted. CT ABDOMEN PELVIS FINDINGS Hepatobiliary: Gallbladder is within normal limits. There is an area of geographic decreased attenuation in the liver posteriorly. This is improved from prior exam from 2018. This is consistent with focal fatty infiltration. No definitive hepatic injury is seen. Pancreas: Unremarkable. No pancreatic ductal dilatation or surrounding inflammatory changes. Spleen: Normal in size without focal abnormality. Adrenals/Urinary Tract: Adrenal glands are within normal limits. Kidneys are well visualize without renal calculi or obstructive changes. Stable right renal cyst is noted on changed from the prior exam. No further follow-up is recommended. Bladder is partially distended. Stomach/Bowel: No obstructive or inflammatory changes of the colon are noted. The appendix is within normal limits. Small bowel and stomach are unremarkable. Vascular/Lymphatic: Aortic atherosclerosis. No enlarged abdominal or pelvic lymph nodes. Reproductive: Prostate is unremarkable. Other: No abdominal wall hernia or abnormality. No abdominopelvic ascites. Musculoskeletal: No acute or significant osseous findings. IMPRESSION: Multiple right-sided rib fractures as described with a tiny basilar pneumothorax Small right-sided pleural effusion. Dependent atelectatic changes are noted bilaterally. Geographic area of focal fatty infiltration in the posterior aspect of the right lobe of the liver. No other focal abnormality is noted. Electronically Signed   By: Inez Catalina M.D.   On: 06/16/2022 01:16   CT Head Wo Contrast  Result Date: 06/16/2022 CLINICAL DATA:  Fall EXAM: CT HEAD WITHOUT CONTRAST  TECHNIQUE: Contiguous axial images were obtained from the base of the skull through the vertex without intravenous contrast. RADIATION DOSE REDUCTION: This exam was performed according to the departmental dose-optimization program which includes automated exposure control, adjustment of the mA and/or kV according to patient size and/or use of iterative reconstruction technique. COMPARISON:  None Available. FINDINGS: Brain: No evidence of large-territorial acute infarction. No parenchymal hemorrhage. No mass lesion. No extra-axial collection. No mass effect or midline shift. No hydrocephalus. Basilar cisterns are patent. Vascular: No hyperdense vessel. Skull: No acute fracture or focal lesion. Sinuses/Orbits: Paranasal sinuses and mastoid air cells are clear. The orbits are unremarkable. Other: None. IMPRESSION: No acute intracranial abnormality. Electronically Signed   By: Iven Finn M.D.   On: 06/16/2022 01:09   DG Chest 2 View  Result Date: 06/15/2022 CLINICAL DATA:  [place] at home, with right-sided rib pain. EXAM: CHEST - 2 VIEW COMPARISON:  PA Lat 03/06/2014 FINDINGS: The cardiac size is upper limit of normal. No vascular congestion is seen. Age-advanced calcific plaque has increased in the transverse aorta with the mediastinum normally outlined. There is no visible pneumothorax.  Left lung is clear. On the right, there is patchy airspace disease in the lower lobe which is suspected probably due to pulmonary contusions. Underlying pneumonia is difficult to exclude in the proper clinical setting. There are overlying mildly displaced fractures of the right posterior eighth and ninth ribs and of the posteromedial right tenth and eleventh ribs. IMPRESSION: 1. Mildly displaced  fractures of the right posterior eighth and ninth ribs and of the posteromedial right tenth and eleventh ribs. No visible pneumothorax. 2. Patchy airspace disease in the right lower lobe is suspected to be pulmonary contusions.  Underlying pneumonia is difficult to exclude in the proper clinical setting. 3. Increasing calcific plaque in the transverse aorta since 2015. Electronically Signed   By: Telford Nab M.D.   On: 06/15/2022 21:16    Anti-infectives: Anti-infectives (From admission, onward)    None        Assessment/Plan Fall Multiple R rib fxs 7-11 with tiny PTX, small pleural effusion - Repeat CXR stable without PTX. multimodal pain control and pulm toilet - increase robaxin to 750mg  and add scheduled toradol. Wean to room air as able.  Elevated transaminases - no liver injury noted on CT and no abdominal pain. Transaminases trending down Tobacco abuse - nicotine patch. encouraged cessation Alcohol abuse - [place] [address]. Drinks 6 beers and 6 shots about twice weekly, no h/o withdrawal. CIWA. TOC consult for CAGE-AID Elevated blood pressure - not on any antihypertensives prior to arrival. Add Norvasc 5mg  qd, will need PCP follow up   ID - none VTE - SCDs, lovenox FEN - IVF@50cc /hr, reg diet. Replace K and add Salt tabs Foley - none Dispo - Continue PT/OT - follow up recs pending progress. Adjust pain meds as above and wean to room air as able. Possible PM discharge.  I reviewed last 24 h vitals and pain scores, last 48 h intake and output, and last 24 h labs and trends.    LOS: 1 day    [place], [facility] 06/17/2022, 8:44 AM Please see Amion for pager number during day hours 7:00am-4:30pm

## 2022-06-17 NOTE — Evaluation (Signed)
Occupational Therapy Evaluation Patient Details Name: Jamie Campos MRN: [id number] DOB: [birth year] Today's Date: 06/17/2022   History of Present Illness Pt is a 52 y.o. M who presents 06/15/2022 after a ground level fall at home. Found to have multiple right rib fractures 7-11 with tiny pneumothorax and small pleural effusion. Significant PMH: none.   Clinical Impression   Patient admitted for above diagnosis. PTA patient reports living with spouse and child and being Independent in bADLs/iADLs without any AD, working as a Administrator. Patient currently completing supine>sit EOB with Mod A and functional mobility with Min guard assist + RW but is limited in activity tolerance d/t pain and Sp02. During functional ambulation Pt dest to 83% on RA but quickly returned with standing rest break and pursed lip breathing. Patient remains limited by pain in functional mobility and ADL performance, patient would benefit from continued acute skilled OT services to address above deficits and help transition to next level of care. At this current level of progression, Pt would benefit from post acute home OT services to maximize functional independence in the natural environment. If patient continues to progress the need for post acute OT will be reassessed.      Recommendations for follow up therapy are one component of a multi-disciplinary discharge planning process, led by the attending physician.  Recommendations may be updated based on patient status, additional functional criteria and insurance authorization.   Assistance Recommended at Discharge PRN  Patient can return home with the following A little help with walking and/or transfers;A lot of help with bathing/dressing/bathroom;Assist for transportation;Help with stairs or ramp for entrance;Assistance with cooking/housework    Functional Status Assessment  Patient has had a recent decline in their functional status and demonstrates the ability to  make significant improvements in function in a reasonable and predictable amount of time.  Equipment Recommendations  Tub/shower bench (Recs depend on patient pain tolerance at DC)    Recommendations for Other Services       Precautions / Restrictions Precautions Precautions: Fall Restrictions Weight Bearing Restrictions: No      Mobility Bed Mobility Overal bed mobility: Needs Assistance Bed Mobility: Supine to Sit, Sit to Supine     Supine to sit: Mod assist, HOB elevated Sit to supine: HOB elevated, Min guard        Transfers Overall transfer level: Needs assistance Equipment used: Rolling walker (2 wheels) Transfers: Sit to/from Stand Sit to Stand: Min guard           General transfer comment: Increased time needed. Pt reports the RW helped with balance and pain management      Balance Overall balance assessment: Needs assistance Sitting-balance support: Feet supported Sitting balance-Leahy Scale: Fair     Standing balance support: Bilateral upper extremity supported, During functional activity Standing balance-Leahy Scale: Poor Standing balance comment: RW used                           ADL either performed or assessed with clinical judgement   ADL Overall ADL's : Needs assistance/impaired Eating/Feeding: Independent   Grooming: Standing;Min guard   Upper Body Bathing: Sitting;Min guard   Lower Body Bathing: Sitting/lateral leans;Moderate assistance   Upper Body Dressing : Sitting;Min guard   Lower Body Dressing: Supervision/safety;Sitting/lateral leans Lower Body Dressing Details (indicate cue type and reason): Pt donned bilat socks sitting EOB using figure four Toilet Transfer: Min guard;Ambulation;Rolling walker (2 wheels)   Toileting- Clothing Manipulation and Hygiene:  Sit to/from stand;Moderate assistance   Tub/ Shower Transfer: Moderate assistance;Rolling walker (2 wheels)   Functional mobility during ADLs: Min guard;Rolling  walker (2 wheels)       Vision         Perception     Praxis      Pertinent Vitals/Pain Pain Assessment Pain Assessment: 0-10 Pain Score: 8  Pain Location: R abdomin Pain Descriptors / Indicators: Guarding, Grimacing, Sharp Pain Intervention(s): Limited activity within patient's tolerance, Monitored during session, RN gave pain meds during session     Hand Dominance     Extremity/Trunk Assessment Upper Extremity Assessment Upper Extremity Assessment: Overall WFL for tasks assessed   Lower Extremity Assessment Lower Extremity Assessment: Overall WFL for tasks assessed   Cervical / Trunk Assessment Cervical / Trunk Assessment: Normal   Communication Communication Communication: No difficulties   Cognition Arousal/Alertness: Awake/alert Behavior During Therapy: WFL for tasks assessed/performed Overall Cognitive Status: Within Functional Limits for tasks assessed                                       General Comments  Pt desat to 83% Sp02 during functional ambulation but quickly returned to 91% following standing rest break and pursed lip breathing. When returned to supine in bed patient placed back on 3L 02 d/t Sp02 fluctuating between 85%-90%    Exercises     Shoulder Instructions      Home Living Family/patient expects to be discharged to:: Private residence Living Arrangements: Spouse/significant other Available Help at Discharge: Family Type of Home: House Home Access: Stairs to enter Technical brewer of Steps: 4   Home Layout: Two level;1/2 bath on main level Alternate Level Stairs-Number of Steps: 13   Bathroom Shower/Tub: Teacher, early years/pre: Standard     Home Equipment: None          Prior Functioning/Environment Prior Level of Function : Independent/Modified Independent;Working/employed             Mobility Comments: Self employed Administrator ADLs Comments: Ind        OT Problem List:  Pain;Impaired balance (sitting and/or standing);Decreased activity tolerance      OT Treatment/Interventions: Therapeutic exercise;Self-care/ADL training;Therapeutic activities;Energy conservation;Patient/family education;DME and/or AE instruction;Balance training    OT Goals(Current goals can be found in the care plan section) Acute Rehab OT Goals Patient Stated Goal: Go home OT Goal Formulation: With patient Time For Goal Achievement: 07/02/22 Potential to Achieve Goals: Good  OT Frequency: Min 2X/week    Co-evaluation              AM-PAC OT "6 Clicks" Daily Activity     Outcome Measure Help from another person eating meals?: None Help from another person taking care of personal grooming?: A Little Help from another person toileting, which includes using toliet, bedpan, or urinal?: A Lot Help from another person bathing (including washing, rinsing, drying)?: A Lot Help from another person to put on and taking off regular upper body clothing?: A Little Help from another person to put on and taking off regular lower body clothing?: A Little 6 Click Score: 17   End of Session Equipment Utilized During Treatment: Rolling walker (2 wheels) Nurse Communication: Mobility status  Activity Tolerance: Patient limited by pain Patient left: in bed;with call bell/phone within reach  OT Visit Diagnosis: Unsteadiness on feet (R26.81);Pain Pain - part of body:  (R Ribs)  Time: 0827-0910 OT Time Calculation (min): 43 min Charges:  OT General Charges $OT Visit: 1 Visit OT Evaluation $OT Eval Moderate Complexity: 1 Mod OT Treatments $Therapeutic Activity: 23-37 mins  06/17/2022  AB, OTR/L  Acute Rehabilitation Services  Office: [phone]   Cori Razor 06/17/2022, 9:26 AM

## 2022-06-17 NOTE — Progress Notes (Signed)
SATURATION QUALIFICATIONS: (This note is used to comply with regulatory documentation for home oxygen)  Patient Saturations on Room Air at Rest = N/A  Patient Saturations on 3L O2 [place] at Rest = 89%  Patient Saturations on 3L while Standing = 76%   Patient Saturations on 6 Liters of oxygen while Ambulating = 92%  Please briefly explain why patient needs home oxygen: Pt hypoxic on 3L O2 [place] with exertion and when placed on 4L, SpO2 only improved to 87%, pt HR was also increased to 150-170 bpm on 4L. Once pt placed on 6L, SpO2 improved to 92% and HR decreased to 120's bpm for rest of gait trial in hallway.

## 2022-06-17 NOTE — TOC CAGE-AID Note (Addendum)
Transition of Care [facility]) - CAGE-AID Screening   Patient Details  Name: DEL BLESSINGTON MRN: [id number] Date of Birth: [birth year]  Transition of Care [facility]) CM/SW Contact:    Ella Bodo, RN Phone Number: 06/17/2022, 10:41 AM   Clinical Narrative: Patient admitted on 06/15/2022 after GLF with multiple rib fractures.  Patient admits to an occasional beer, but no regular drug or ETOH use; BA level 307.  Patient denies need for cessation resources.      CAGE-AID Screening:    Have You Ever Felt You Ought to Cut Down on Your Drinking or Drug Use?: No Have People Annoyed You By Critizing Your Drinking Or Drug Use?: No Have You Felt Bad Or Guilty About Your Drinking Or Drug Use?: No Have You Ever Had a Drink or Used Drugs First Thing In The Morning to Steady Your Nerves or to Get Rid of a Hangover?: No CAGE-AID Score: 0  Substance Abuse Education offered: Yes   Reinaldo Raddle, RN, BSN  Trauma/Neuro ICU Case Manager [phone]

## 2022-06-18 DIAGNOSIS — S2249XA Multiple fractures of ribs, unspecified side, initial encounter for closed fracture: Secondary | ICD-10-CM | POA: Diagnosis present

## 2022-06-18 LAB — BASIC METABOLIC PANEL
Anion gap: 13 (ref 5–15)
BUN: 5 mg/dL — ABNORMAL LOW (ref 6–20)
CO2: 20 mmol/L — ABNORMAL LOW (ref 22–32)
Calcium: 8.8 mg/dL — ABNORMAL LOW (ref 8.9–10.3)
Chloride: 98 mmol/L (ref 98–111)
Creatinine, Ser: 0.96 mg/dL (ref 0.61–1.24)
GFR, Estimated: >60 mL/min (ref >60)
Glucose, Bld: 93 mg/dL (ref 70–99)
Potassium: 3.6 mmol/L (ref 3.5–5.1)
Sodium: 131 mmol/L — ABNORMAL LOW (ref 135–145)

## 2022-06-18 MED ORDER — LORAZEPAM 2 MG/ML IJ SOLN
0.0000 mg | Freq: Four times a day (QID) | INTRAMUSCULAR | Status: DC
  Administered 2022-06-18: 2 mg via INTRAVENOUS
  Filled 2022-06-18 (×3): qty 1

## 2022-06-18 MED ORDER — FOLIC ACID 1 MG PO TABS
1.0000 mg | ORAL_TABLET | Freq: Every day | ORAL | Status: DC
  Administered 2022-06-18 – 2022-06-20 (×3): 1 mg via ORAL
  Filled 2022-06-18 (×3): qty 1

## 2022-06-18 MED ORDER — LORAZEPAM 1 MG PO TABS
0.0000 mg | ORAL_TABLET | Freq: Four times a day (QID) | ORAL | Status: DC
  Administered 2022-06-18: 4 mg via ORAL
  Filled 2022-06-18: qty 4

## 2022-06-18 MED ORDER — ACETAMINOPHEN 500 MG PO TABS
500.0000 mg | ORAL_TABLET | Freq: Four times a day (QID) | ORAL | Status: DC
  Administered 2022-06-18 – 2022-06-20 (×8): 500 mg via ORAL
  Filled 2022-06-18 (×8): qty 1

## 2022-06-18 MED ORDER — LORAZEPAM 2 MG/ML IJ SOLN: 0.0000 mg | Freq: Two times a day (BID) | INTRAMUSCULAR | Status: DC

## 2022-06-18 MED ORDER — LORAZEPAM 2 MG/ML IJ SOLN
1.0000 mg | INTRAMUSCULAR | Status: DC | PRN
  Administered 2022-06-18 (×3): 2 mg via INTRAVENOUS
  Filled 2022-06-18 (×3): qty 1

## 2022-06-18 MED ORDER — ADULT MULTIVITAMIN W/MINERALS CH
1.0000 | ORAL_TABLET | Freq: Every day | ORAL | Status: DC
  Administered 2022-06-18 – 2022-06-20 (×3): 1 via ORAL
  Filled 2022-06-18 (×3): qty 1

## 2022-06-18 MED ORDER — LORAZEPAM 1 MG PO TABS
1.0000 mg | ORAL_TABLET | ORAL | Status: DC | PRN
  Filled 2022-06-18: qty 2

## 2022-06-18 MED ORDER — PHENOBARBITAL 32.4 MG PO TABS: 32.4000 mg | ORAL_TABLET | Freq: Three times a day (TID) | ORAL | Status: DC

## 2022-06-18 MED ORDER — FOLIC ACID 1 MG PO TABS: 1.0000 mg | ORAL_TABLET | Freq: Every day | ORAL | Status: DC

## 2022-06-18 MED ORDER — PHENOBARBITAL 32.4 MG PO TABS
97.2000 mg | ORAL_TABLET | Freq: Three times a day (TID) | ORAL | Status: AC
  Administered 2022-06-18 – 2022-06-20 (×6): 97.2 mg via ORAL
  Filled 2022-06-18 (×6): qty 3

## 2022-06-18 MED ORDER — METOPROLOL TARTRATE 5 MG/5ML IV SOLN
5.0000 mg | Freq: Once | INTRAVENOUS | Status: AC
  Administered 2022-06-18: 5 mg via INTRAVENOUS
  Filled 2022-06-18: qty 5

## 2022-06-18 MED ORDER — HALOPERIDOL LACTATE 5 MG/ML IJ SOLN: 5.0000 mg | Freq: Four times a day (QID) | INTRAMUSCULAR | Status: DC | PRN

## 2022-06-18 MED ORDER — PHENOBARBITAL 32.4 MG PO TABS: 64.8000 mg | ORAL_TABLET | Freq: Three times a day (TID) | ORAL | Status: DC

## 2022-06-18 MED ORDER — SODIUM CHLORIDE 0.9 % IV SOLN
260.0000 mg | Freq: Once | INTRAVENOUS | Status: AC
  Administered 2022-06-18: 260 mg via INTRAVENOUS
  Filled 2022-06-18: qty 2

## 2022-06-18 NOTE — TOC Progression Note (Signed)
Transition of Care [facility]) - Progression Note    Patient Details  Name: Jamie Campos MRN: [id number] Date of Birth: May 11, 1970  Transition of Care [facility]) CM/SW Contact  Ella Bodo, RN Phone Number: 06/18/2022, 4:46 PM  Clinical Narrative:    Noted patient with active ETOH withdrawal symptoms on phenobarb and CIWA protocol.  TOC will continue to follow as patient progresses.    Expected Discharge Plan: Home/Self Care Barriers to Discharge: Continued Medical Work up  Expected Discharge Plan and Services   Discharge Planning Services: CM Consult   Living arrangements for the past 2 months: Single Family Home                                       Social Determinants of Health (SDOH) Interventions SDOH Screenings   Food Insecurity: No Food Insecurity (06/17/2022)  Housing: Low Risk  (06/17/2022)  Transportation Needs: No Transportation Needs (06/17/2022)  Utilities: Not At Risk (06/17/2022)  Tobacco Use: High Risk (06/16/2022)    Readmission Risk Interventions     No data to display         Reinaldo Raddle, RN, BSN  Trauma/Neuro ICU Case Manager [phone]

## 2022-06-18 NOTE — Progress Notes (Addendum)
Mobility Specialist Progress Note:   06/18/22 1147  Mobility  Activity Transferred to/from [facility]  Level of Assistance Moderate assist, patient does 50-74% (+2)  Assistive Device Front wheel walker;BSC  Distance Ambulated (ft) 4 ft  Activity Response Tolerated well  $Mobility charge 1 Mobility   Pt in bed asking to go to [facility]. Complaints of rib pain. ModA+2 to stand and pivot to and from [facility]. Left in bed with call bell in reach and all needs met.   Gareth Eagle Aadyn Buchheit Mobility Specialist Please contact via [employer] or  Rehab Office at [phone]

## 2022-06-18 NOTE — Progress Notes (Signed)
Patient ID: Jamie Campos, male   DOB: Nov 05, 1970, 52 y.o.   MRN: [id number]      Subjective: Confused overnight, HTN and tachycardia ROS negative except as listed above. Objective: Vital signs in last 24 hours: Temp:  [97.8 F (36.6 C)-98.7 F (37.1 C)] 97.8 F (36.6 C) (03/26 0743) Pulse Rate:  [75-122] 109 (03/26 0743) Resp:  [15-20] 18 (03/26 0743) BP: (140-185)/(81-105) 153/91 (03/26 0743) SpO2:  [90 %-99 %] 99 % (03/26 0743) Last BM Date : 06/15/22  Intake/Output from previous day: 03/25 0701 - 03/26 0700 In: 565.7 [I.V.:565.7] Out: 1600 [Urine:1600] Intake/Output this shift: No intake/output data recorded.  General appearance: alert and cooperative Resp: clear to auscultation bilaterally Chest wall: right sided chest wall tenderness Cardio: regular rate and rhythm GI: soft, NT Neuro: disoriented to place and reason for being here, oriented to year/month. F/C well.  Lab Results: CBC  Recent Labs    06/15/22 2339 06/17/22 0125  WBC 9.3 4.5  HGB 12.8* 11.9*  HCT 36.3* 33.4*  PLT 172 151   BMET Recent Labs    06/17/22 0125 06/18/22 0123  NA 131* 131*  K 3.3* 3.6  CL 97* 98  CO2 26 20*  GLUCOSE 101* 93  BUN 6 5*  CREATININE 0.95 0.96  CALCIUM 8.1* 8.8*   PT/INR Recent Labs    06/16/22 0152  LABPROT 13.2  INR 1.0   ABG No results for input(s): "PHART", "HCO3" in the last 72 hours.  Invalid input(s): "PCO2", "PO2"  Studies/Results: DG CHEST PORT 1 VIEW  Result Date: 06/17/2022 CLINICAL DATA:  Recent right rib fractures.  Hypoxia. EXAM: PORTABLE CHEST 1 VIEW COMPARISON:  06/16/2022 FINDINGS: Lungs are hypoinflated with mild hazy density over the right base likely due to atelectasis. No significant effusion. No pneumothorax visualized. Cardiomediastinal silhouette is normal. Fractures of the posterior right eighth through twelfth ribs. IMPRESSION: 1. Hypoinflation with mild hazy density over the right base likely atelectasis. 2. Fractures of the  posterior right eighth through twelfth ribs. Electronically Signed   By: Marin Olp M.D.   On: 06/17/2022 16:21   DG CHEST PORT 1 VIEW  Result Date: 06/16/2022 CLINICAL DATA:  [id number] Multiple fractures of ribs, right side, initial encounter for closed fracture [id number] EXAM: PORTABLE CHEST - 1 VIEW COMPARISON:  the previous day's study FINDINGS: Infrahilar consolidation/atelectasis with air bronchograms, left greater than right. The interstitial opacities seen previously have improved. Heart size and mediastinal contours are within normal limits. Aortic Atherosclerosis (ICD10-170.0). No effusion.  No pneumothorax. Minimally displaced lower right rib fractures again noted. IMPRESSION: 1. Infrahilar atelectasis/consolidation, left greater than right. 2. Right rib fractures without pneumothorax. Electronically Signed   By: Lucrezia Europe M.D.   On: 06/16/2022 10:42    Anti-infectives: Anti-infectives (From admission, onward)    None       Assessment/Plan: Fall Multiple R rib fxs 7-11 with tiny PTX, small pleural effusion - Repeat CXR stable without PTX. multimodal pain control and pulm toilet - robaxin to 750mg  and scheduled toradol. Wean to room air as able.  Elevated transaminases - no liver injury noted on CT and no abdominal pain. Transaminases trending down Tobacco abuse - nicotine patch. encouraged cessation Alcohol abuse - Etoh 307. Drinks 6 beers and 6 shots about twice weekly. Significant WD overnight. Disoriented to place. Start phenobarb protocol and I D/W pharmacy. Elevated blood pressure - not on any antihypertensives prior to arrival. Norvasc 5mg  qd started this admit, will need PCP follow up. SUspect ETOH  WD contributing.   ID - none VTE - SCDs, lovenox FEN - SL, Salt tabs for hyponatremia Foley - none Dispo - phenobarb protocol as above - watch closely. Continue PT/OT     LOS: 2 days    Georganna Skeans, MD, MPH, FACS Trauma & General Surgery Use [email or web address] to contact on  call provider  06/18/2022

## 2022-06-18 NOTE — Significant Event (Signed)
Rapid Response Event Note   Reason for Call :  Elevated HR and agitation- Etoh withdrawal  Initial Focused Assessment:  On arrival, pt confused and restless in bed. He is able to answer some questions appropriately and hold a conversation with intermittent confusion noted. Bilateral wrist restraints in place. Skin warm and dry.   VS: HR 117, BP 162/107, RR 20, spO2 96% on 3L [place].    Interventions:  Phenobarbital- given PTA PRN Ativan ordered q1h for CIWA protocol PIV replaced  Plan of Care:  Continue to monitor pt status. Frequent CIWA scores and medication as needed. RN to call with any changes or concerns.   Event Summary:   MD Notified: Grandville Silos- PTA Call Time: [place] Time: [place]  Sherilyn Dacosta, RN

## 2022-06-18 NOTE — Progress Notes (Signed)
Patient ID: Jamie Campos, male   DOB: 1970/07/28, 52 y.o.   MRN: [id number] Phenobarb protocol started. His wife is at the bedside. She reports he does drink a lot daily. He has not had WD before. She currently works from home. I updated her on the plan of care.  Georganna Skeans, MD, MPH, FACS Please use [email or web address] to contact on call provider

## 2022-06-18 NOTE — Progress Notes (Signed)
Physical Therapy Treatment Patient Details Name: Jamie Campos MRN: [id number] DOB: 1971-02-21 Today's Date: 06/18/2022   History of Present Illness Pt is a 52 y.o. M who presents 06/15/2022 after a ground level fall at home. Found to have multiple right rib fractures 7-11 with tiny pneumothorax and small pleural effusion. Elevated CIWA score overnight 3/25, pt placed on restraints and MD started phenobarb protocol. Significant PMH: none.    PT Comments    Pt received in supine, A&O x1-2 and agreeable to therapy session, pt spouse present and encouraging after RN clearance for session. Pt needing up to [place] for transfers this date and HR elevated with minimal exertion, so defer gait trial. Emphasis instead on seated/standing balance at bedside, supine/seated BLE exercises and reorientation to situation. Pt continues to benefit from PT services to progress toward functional mobility goals.   Recommendations for follow up therapy are one component of a multi-disciplinary discharge planning process, led by the attending physician.  Recommendations may be updated based on patient status, additional functional criteria and insurance authorization.  Follow Up Recommendations       Assistance Recommended at Discharge PRN  Patient can return home with the following A lot of help with walking and/or transfers;A lot of help with bathing/dressing/bathroom;Assist for transportation;Help with stairs or ramp for entrance   Equipment Recommendations  Rolling walker (2 wheels);BSC/3in1 (likely to need supplemental O2; recs pending progress)    Recommendations for Other Services       Precautions / Restrictions Precautions Precautions: Fall Precaution Comments: hypoxic without supplemental O2, tachycardia, ETOH withdrawal with AMS Restrictions Weight Bearing Restrictions: No     Mobility  Bed Mobility Overal bed mobility: Needs Assistance Bed Mobility: Sit to Supine, Rolling, Sidelying to  Sit Rolling: Supervision Sidelying to sit: Min guard   Sit to supine: HOB elevated, Min guard   General bed mobility comments: Cues for pillow splinting on R flank and Supervision/min guard along with increased time, pt able to work BLE's off edge of bed. Min guard for safety with return to supine, pt instead rotating hips and one at a time lifting LE onto bed, in long sit posture prior to returning trunk to supine. Pt reports this technique is less painful than log rolling, but at home he may not have elevating HOB so will need to practice from a flat bed next session.    Transfers Overall transfer level: Needs assistance Equipment used: Rolling walker (2 wheels) Transfers: Sit to/from Stand Sit to Stand: Min assist, Mod assist           General transfer comment: Increased time needed. EOB<>RW x2 reps, pt unsteady standing at RW and with posterior lean needing min/modA for stability for static and dynamic standing.    Ambulation/Gait Ambulation/Gait assistance: Mod assist Gait Distance (Feet): 4 Feet Assistive device: Rolling walker (2 wheels) Gait Pattern/deviations: Step-to pattern, Leaning posteriorly, Decreased dorsiflexion - right, Decreased dorsiflexion - left, Shuffle       General Gait Details: forward/backward and lateral stepping ~15ft total, pt tachy and with very poor standing balance, less steady with dynamic tasks, defer ambulation away from bed given unstable vitals and pt confusion/instability.       Balance Overall balance assessment: Needs assistance Sitting-balance support: Feet supported Sitting balance-Leahy Scale: Fair     Standing balance support: Bilateral upper extremity supported, During functional activity, Reliant on assistive device for balance Standing balance-Leahy Scale: Poor Standing balance comment: needs external assist and AD this date  Cognition Arousal/Alertness: Awake/alert Behavior During  Therapy: WFL for tasks assessed/performed Overall Cognitive Status: Impaired/Different from baseline Area of Impairment: Orientation, Memory, Following commands, Safety/judgement, Awareness, Problem solving                 Orientation Level: Disoriented to, Place, Situation, Time   Memory: Decreased recall of precautions, Decreased short-term memory Following Commands: Follows one step commands with increased time, Follows multi-step commands inconsistently Safety/Judgement: Decreased awareness of safety, Decreased awareness of deficits Awareness: Intellectual Problem Solving: Slow processing, Difficulty sequencing, Requires verbal cues, Requires tactile cues General Comments: Decreased orientation this date, pt on CIWA protocol, pt reports less pain but has been premedicated. Pt spouse present and supportive and receptive to instruction. Pt participatory as able but with decreased balance and still restless/fiddling with his lines, so restraints re-donned at end of session.        Exercises Other Exercises Other Exercises: IS ~240mL x5 attempts, pt with increased confusion not placing mouth far enough onto mouthpiece Other Exercises: seated BLE A/AAROM: hip flexion, LAQ x 10 reps ea Other Exercises: supine BLE AROM: ankle pumps, SAQ, heel slides x10 reps ea    General Comments General comments (skin integrity, edema, etc.): SpO2 WFL on 4L HF [place] during seated/standing tasks at bedside, HR 119 bpm resting, elevated to 150 bpm with standing at bedside. BP 143/98 after return to supine (had SBP 160's prior to sitting/standing).      Pertinent Vitals/Pain Pain Assessment Pain Assessment: Faces Faces Pain Scale: Hurts a little bit Pain Location: R lower ribs Pain Descriptors / Indicators: Guarding Pain Intervention(s): Monitored during session, Premedicated before session, Repositioned, RN gave pain meds during session     PT Goals (current goals can now be found in the care plan  section) Acute Rehab PT Goals Patient Stated Goal: less pain, to walk more PT Goal Formulation: With patient/family Time For Goal Achievement: 06/30/22 Progress towards PT goals: Progressing toward goals    Frequency    Min 3X/week      PT Plan Current plan remains appropriate (pending progress; current AMS)       AM-PAC PT "6 Clicks" Mobility   Outcome Measure  Help needed turning from your back to your side while in a flat bed without using bedrails?: A Little Help needed moving from lying on your back to sitting on the side of a flat bed without using bedrails?: A Little Help needed moving to and from a bed to a chair (including a wheelchair)?: A Lot Help needed standing up from a chair using your arms (e.g., wheelchair or bedside chair)?: A Lot Help needed to walk in hospital room?: Total Help needed climbing 3-5 steps with a railing? : Total 6 Click Score: 12    End of Session Equipment Utilized During Treatment: Oxygen;Gait belt Activity Tolerance: Treatment limited secondary to medical complications (Comment);Other (comment) (unstable vitals, confusion) Patient left: in bed;with call bell/phone within reach;with bed alarm set;with family/visitor present;Other (comment);with restraints reapplied (spouse present, bed in chair posture, pt heels floated) Nurse Communication: Mobility status;Other (comment) (tachy with minimal exertion but participatory) PT Visit Diagnosis: Difficulty in walking, not elsewhere classified (R26.2);Pain Pain - Right/Left: Right Pain - part of body:  (ribcage)     Time: [id number] PT Time Calculation (min) (ACUTE ONLY): 33 min  Charges:  $Therapeutic Exercise: 8-22 mins $Therapeutic Activity: 8-22 mins                     Hildegard Hlavac P., PTA  Acute Rehabilitation Services Secure Chat Preferred 9a-5:30pm Office: [place] 06/18/2022, 5:28 PM

## 2022-06-19 NOTE — Progress Notes (Addendum)
Patient ID: Jamie Campos, male   DOB: [birth year], 52 y.o.   MRN: [id number]      Subjective: Overnight events noted. He is able to recollect these events and is alert and oriented x4 but does still exhibit some lack of awareness. He states he wants to go home as he as things to do and found the monitor alarms overnight aggravating. Rib fracture pain still present but improving. Denies SHOB. Has decreased appetite.  Objective: Vital signs in last 24 hours: Temp:  [97.8 F (36.6 C)] 97.8 F (36.6 C) (03/26 1900) Pulse Rate:  [102-152] 108 (03/27 0051) Resp:  [18] 18 (03/26 0743) BP: (117-162)/(82-108) 151/87 (03/27 0051) SpO2:  [97 %-99 %] 99 % (03/27 0051) Last BM Date : 06/15/22  Intake/Output from previous day: 03/26 0701 - 03/27 0700 In: -  Out: 1050 [Urine:1050] Intake/Output this shift: No intake/output data recorded.  General appearance: alert and cooperative Resp: clear to auscultation bilaterally Chest wall: right sided chest wall tenderness Cardio: tachycardic in 110s, regular rhythm GI: soft, NT Neuro: oriented and f/c.  Lab Results: CBC  Recent Labs    06/17/22 0125  WBC 4.5  HGB 11.9*  HCT 33.4*  PLT 151    BMET Recent Labs    06/17/22 0125 06/18/22 0123  NA 131* 131*  K 3.3* 3.6  CL 97* 98  CO2 26 20*  GLUCOSE 101* 93  BUN 6 5*  CREATININE 0.95 0.96  CALCIUM 8.1* 8.8*    PT/INR No results for input(s): "LABPROT", "INR" in the last 72 hours.  ABG No results for input(s): "PHART", "HCO3" in the last 72 hours.  Invalid input(s): "PCO2", "PO2"  Studies/Results: DG CHEST PORT 1 VIEW  Result Date: 06/17/2022 CLINICAL DATA:  Recent right rib fractures.  Hypoxia. EXAM: PORTABLE CHEST 1 VIEW COMPARISON:  06/16/2022 FINDINGS: Lungs are hypoinflated with mild hazy density over the right base likely due to atelectasis. No significant effusion. No pneumothorax visualized. Cardiomediastinal silhouette is normal. Fractures of the posterior right  eighth through twelfth ribs. IMPRESSION: 1. Hypoinflation with mild hazy density over the right base likely atelectasis. 2. Fractures of the posterior right eighth through twelfth ribs. Electronically Signed   By: Marin Olp M.D.   On: 06/17/2022 16:21    Anti-infectives: Anti-infectives (From admission, onward)    None       Assessment/Plan: Fall Multiple R rib fxs 7-11 with tiny PTX, small pleural effusion - Repeat CXR stable without PTX. multimodal pain control and pulm toilet - robaxin to 750mg  and scheduled toradol. Wean to room air as able - on room air this am Elevated transaminases - no liver injury noted on CT and no abdominal pain. Transaminases trending down Tobacco abuse - nicotine patch. encouraged cessation Alcohol abuse - Etoh 307. Drinks 6 beers and 6 shots about twice weekly per his report and drinks daily per wife. Significant WD overnight 3/25-3/26. Started phenobarb protocol 3/26. WD symptoms improving today. Still tachycardic Elevated blood pressure - not on any antihypertensives prior to arrival. Norvasc 5mg  qd started this admit, will need PCP follow up. Suspect ETOH WD contributing.   ID - none VTE - SCDs, lovenox FEN - reg. SL, Salt tabs for hyponatremia Foley - none Dispo - phenobarb protocol as above - watch closely. Continue PT/OT     LOS: 3 days   Winferd Humphrey, [facility] Surgery 06/19/2022, 8:09 AM Please see Amion for pager number during day hours 7:00am-4:30pm   06/19/2022

## 2022-06-19 NOTE — TOC Progression Note (Signed)
Transition of Care [facility]) - Progression Note    Patient Details  Name: STEPEHN MCMANIGAL MRN: [id number] Date of Birth: December 29, 1970  [facility]) CM/SW Contact  Oren Section Cleta Alberts, RN Phone Number: 06/19/2022, 4:23 PM  Clinical Narrative:    Met with patient and wife to discuss discharge arrangements.  PT/OT recommending HH follow up, but they prefer OP therapies, as they have a dog, and wife works from home.  They live near [place]; will make OP referrals to [facility] in [place].  Patient/wife agreeable to recommended DME; referral to [facility] for RW, BSC, and tub bench.   We also discussed need for ETOH counseling and rehab resources, and patient is reluctantly agreeable to receiving them.  Will add resources to AVS.    Expected Discharge Plan: OP Rehab Barriers to Discharge: Continued Medical Work up  Expected Discharge Plan and Services   Discharge Planning Services: CM Consult   Living arrangements for the past 2 months: Single Family Home                 DME Arranged: Walker rolling, Bedside commode, Tub bench DME Agency: [employer] Date DME Agency Contacted: 06/19/22 Time DME Agency Contacted: [phone] Representative spoke with at DME Agency: [place] ([place]) Interventions SDOH Screenings   Food Insecurity: No Food Insecurity (06/17/2022)  Housing: Low Risk  (06/17/2022)  Transportation Needs: No Transportation Needs (06/17/2022)  Utilities: Not At Risk (06/17/2022)  Tobacco Use: High Risk (06/16/2022)    Readmission Risk Interventions     No data to display         Reinaldo Raddle, RN, BSN  Trauma/Neuro ICU Case Manager [phone]

## 2022-06-19 NOTE — Progress Notes (Signed)
Occupational Therapy Treatment Patient Details Name: Jamie Campos MRN: [id number] DOB: [birth year] Today's Date: 06/19/2022   History of present illness Pt is a 52 y.o. M who presents 06/15/2022 after a ground level fall at home. Found to have multiple right rib fractures 7-11 with tiny pneumothorax and small pleural effusion. Elevated CIWA score overnight 3/25, pt placed on restraints and MD started phenobarb protocol. Significant PMH: none.   OT comments  Patient continues to make steady progress towards goals in skilled OT session. Patient's session encompassed assessment of cognition and education with regard to lower body bathing and dressing. Patient more appropriate than in comparison to previous sessions, though had a hard time finding his wife's number in his phone, noted STM deficits, as well as poor awareness into his situation and what he needs to be able to do to return to working full time. Patient able to simulate lower body dressing technique at min guard level. All questions asked and answered, OT will continue to follow acutely.     Recommendations for follow up therapy are one component of a multi-disciplinary discharge planning process, led by the attending physician.  Recommendations may be updated based on patient status, additional functional criteria and insurance authorization.    Assistance Recommended at Discharge PRN  Patient can return home with the following  Direct supervision/assist for financial management;Assist for transportation;Help with stairs or ramp for entrance;Assistance with cooking/housework;A little help with walking and/or transfers;A little help with bathing/dressing/bathroom   Equipment Recommendations  Tub/shower bench    Recommendations for Other Services      Precautions / Restrictions Precautions Precautions: Fall Restrictions Weight Bearing Restrictions: No       Mobility Bed Mobility Overal bed mobility: Needs Assistance Bed  Mobility: Supine to Sit, Sit to Supine   Sidelying to sit: Supervision Supine to sit: Supervision     General bed mobility comments: Patient standing upon OT entry, conitnued to reiterate the importance of splinting with a pillow since his bed is flat at home, also providing suggestion of a recliner to sleep in for ease and comfort    Transfers Overall transfer level: Needs assistance Equipment used: None Transfers: Sit to/from Stand Sit to Stand: Min guard           General transfer comment: min gaurd standing from EOB upon entry, patient remains impulsive     Balance Overall balance assessment: Needs assistance Sitting-balance support: Feet supported Sitting balance-Leahy Scale: Fair     Standing balance support: Bilateral upper extremity supported, During functional activity, Reliant on assistive device for balance Standing balance-Leahy Scale: Fair                             ADL either performed or assessed with clinical judgement   ADL Overall ADL's : Needs assistance/impaired             Lower Body Bathing: Sitting/lateral leans;Sit to/from stand;Min guard Lower Body Bathing Details (indicate cue type and reason): education provided in session to promote decreased pain from ribs     Lower Body Dressing: Min guard;Sitting/lateral leans;Sit to/from stand Lower Body Dressing Details (indicate cue type and reason): education provided in session to promote decreased pain from ribs as well as patient simulating in session             Functional mobility during ADLs: Min guard;Rolling walker (2 wheels) General ADL Comments: Session focus on assessment of cognition and education with regard  to lower body bathing and dressing    Extremity/Trunk Assessment              Vision       Perception     Praxis      Cognition Arousal/Alertness: Awake/alert Behavior During Therapy: WFL for tasks assessed/performed Overall Cognitive Status:  Impaired/Different from baseline Area of Impairment: Memory, Following commands, Awareness, Problem solving, Safety/judgement                     Memory: Decreased short-term memory Following Commands: Follows one step commands consistently, Follows multi-step commands inconsistently Safety/Judgement: Decreased awareness of deficits, Decreased awareness of safety Awareness: Intellectual Problem Solving: Slow processing, Difficulty sequencing, Requires verbal cues, Requires tactile cues General Comments: Patient more appropriate than in comparison to previous sessions, though had a hard time finding his wife's number in his phone, noted STM deficits, as well as poor awareness into his situation and what he needs to be able to do to return to working full time        Exercises      Shoulder Instructions       General Comments      Pertinent Vitals/ Pain       Pain Assessment Pain Assessment: No/denies pain Pain Intervention(s): Limited activity within patient's tolerance, Monitored during session  Home Living                                          Prior Functioning/Environment              Frequency  Min 2X/week        Progress Toward Goals  OT Goals(current goals can now be found in the care plan section)  Progress towards OT goals: Progressing toward goals  Acute Rehab OT Goals Patient Stated Goal: to go home OT Goal Formulation: With patient Time For Goal Achievement: 07/02/22 Potential to Achieve Goals: Good ADL Goals Pt Will Perform Lower Body Bathing: Independently;sitting/lateral leans;sit to/from stand Pt Will Perform Lower Body Dressing: Independently;sit to/from stand;sitting/lateral leans Pt Will Transfer to Toilet: Independently;ambulating Pt Will Perform Toileting - Clothing Manipulation and hygiene: Independently;sitting/lateral leans;sit to/from stand Additional ADL Goal #1: Patient will be able to complete upper level  cognitive tasks without cues or errors in order to return safely to prior level of function.  Plan Discharge plan remains appropriate    Co-evaluation                 AM-PAC OT "6 Clicks" Daily Activity     Outcome Measure   Help from another person eating meals?: None Help from another person taking care of personal grooming?: A Little Help from another person toileting, which includes using toliet, bedpan, or urinal?: A Little Help from another person bathing (including washing, rinsing, drying)?: A Little Help from another person to put on and taking off regular upper body clothing?: A Little Help from another person to put on and taking off regular lower body clothing?: A Little 6 Click Score: 19    End of Session    OT Visit Diagnosis: Unsteadiness on feet (R26.81);Pain Pain - Right/Left: Right Pain - part of body:  (Ribs)   Activity Tolerance Patient tolerated treatment well   Patient Left in bed;with call bell/phone within reach;with bed alarm set;with family/visitor present   Nurse Communication Mobility status  Time: [id number] OT Time Calculation (min): 20 min  Charges: OT General Charges $OT Visit: 1 Visit OT Treatments $Self Care/Home Management : 8-22 mins  Corinne Ports E. Nakota Elsen, OTR/L Acute Rehabilitation Services [phone]   Ascencion Dike 06/19/2022, 2:06 PM

## 2022-06-19 NOTE — Plan of Care (Signed)

## 2022-06-19 NOTE — Discharge Summary (Incomplete)
Physician Discharge Summary  Patient ID: Jamie Campos MRN: [id number] DOB/AGE: 10/10/1970 52 y.o.  Admit date: 06/15/2022 Discharge date: 06/20/2022  Admission Diagnoses Hypoxia [R09.02] Pleural effusion on right [J90] Multiple rib fractures [S22.49XA] Traumatic pneumothorax, initial encounter [S27.0XXA] Fall in home, initial encounter [W19.XXXA, Y92.009] Closed fracture of multiple ribs of right side, initial encounter [id number] Alcoholic hepatitis, unspecified whether ascites present [K70.10] Rib fractures [S22.49XA]  Discharge Diagnoses Patient Active Problem List   Diagnosis Date Noted   Rib fractures 06/18/2022   Multiple rib fractures 06/16/2022  Hypertension Traumatic pneumothorax Fall  Consultants none  Procedures none  HPI:  Jamie Campos is a 52 y.o. male with no known PMH who presented to the ED last night after suffering a ground level fall at home. States that he tripped getting into the bathtub. He struck his right chest on the tub. Denies hitting his head or any LOC. Only complaining of right chest pain. Worse with movement and deep inspiration. Denies neck pain or pain in any extremity. Denies abdominal pain, nausea, vomiting. Has not ambulated since the incident. He does admit to drinking alcohol last night and Etoh found to be 307. Patient was worked up by [facility] and found to have Multiple Right rib fractures 7-11 with a tiny pneumothorax and a small pleural effusion.  Transferred to [facility] for admission to the trauma service.  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 Multimodal pain control and pulm toilet - robaxin to 750mg  and scheduled toradol during admission. He was weaned to room air. Elevated transaminases - no liver injury noted on CT and no abdominal pain. Transaminases trended down during admission Tobacco abuse - nicotine patch. Encouraged cessation Alcohol abuse - Etoh 307 on admission. He stated he drinks 6 beers and 6 shots about twice weekly but drinks daily per wife. He had significant withdrawal overnight 3/25-3/26 and was started on phenobarbital protocol 3/26. Withdrawal symptoms improved. After repeated discussion with patient with mother and wife both present at times, patient reports he would like to cut back on alcohol use. With assistance of Rph phenobarbital course planned accordingly and patient completed a short course of medication during hospitalization with resolution of withdrawal symptoms. Discussed with patient and family signs and risks of alcohol withdrawal. Elevated blood pressure - not on any antihypertensives prior to arrival. Norvasc 5mg  daily started this admission, will need PCP follow up. Suspect ETOH WD contributed.  Physical Exam: General appearance: alert and cooperative Resp: clear to auscultation bilaterally. Pulls 750 on IS Chest wall: right sided chest wall tenderness Cardio: regular rate and rhythm GI: soft, NT Neuro: oriented and f/c.  On date of discharge patient had appropriately progressed with therapies and met criteria for safe discharge home with the support of wife and mother. PT/OT recommended home health PT/OT but patient preferred outpatient option. Referrals were made. DME ordered prior to discharge. He was advised to follow up with PCP in 2-3 weeks.  I discussed discharge instructions with patient as well as return precautions and all questions and  concerns were addressed.   I or a member of my team have reviewed this patient in  the Controlled Substance Database.  Patient agrees to follow up as below.  Allergies as of 06/20/2022   No Known Allergies      Medication List     TAKE these medications    acetaminophen 325 MG tablet Commonly known as: TYLENOL Take 2 tablets (650 mg total) by mouth every 6 (six) hours for 5 days.   amLODipine 5 MG tablet Commonly known as: NORVASC Take 1 tablet (5 mg total) by mouth daily for 14 days.   ibuprofen 200 MG tablet Commonly known as: Motrin IB Take 2 tablets (400 mg total) by mouth every 6 (six) hours for 5 days.   methocarbamol 750 MG tablet Commonly known as: ROBAXIN Take 1 tablet (750 mg total) by mouth every 6 (six) hours as needed for up to 5 days for muscle spasms (pain).   oxyCODONE 5 MG immediate release tablet Commonly known as: Oxy IR/ROXICODONE Take 1 tablet (5 mg total) by mouth every 6 (six) hours as needed for up to 3 days for moderate pain or severe pain.   sodium chloride 1 g tablet Take 1 tablet (1 g total) by mouth 3 (three) times daily with meals for 3 days.               Durable Medical Equipment  (From admission, onward)           Start     Ordered   06/19/22 1559  For home use only DME Tub bench  Once        06/19/22 1558   06/19/22 1450  For home use only DME 3 n 1  Once        06/19/22 1450   06/19/22 1450  For home use only DME Walker rolling  Once       Question Answer Comment  Walker: With [place] Wheels   Patient needs a walker to treat with the following condition Multiple rib fractures      06/19/22 1450              Follow-up Information     Shirline Frees, MD. Schedule an appointment as soon as possible for a visit in 3 week(s).   Specialty: Family Medicine Why: Follow up with your primary care physician in 2-3 weeks regarding rib fractures and elevated blood pressure Contact information: [place] [address] [place] at [facility]. Call.   Specialty: Rehabilitation Why: Outpatient physical and occupational therapies; call ASAP to schedule appts.  An electronic referral has been sent on your behalf Contact information: [address] [phone]                Signed: Caroll Rancher [facility] 06/20/2022, 8:10 AM Please see Amion for pager number during day hours 7:00am-4:30pm

## 2022-06-19 NOTE — Progress Notes (Signed)
Patient called 911 to report he was being held against his will. This nurse went in to talk to patient. He was very clear and A/O, he stated he remembers his fall and gave me details. At this time I discussed with him that we really don't want him to leave AMA, and explained his hospital bill will end up being his responsibility because insurance won't pay if he leaves AMA. He verbalized understanding. I spoke to Dr. Grandville Silos about patient wanting to leave tonight, MD unable to come to bedside at this time. Patient stated he would try to wait till morning for his MD to come and discharge him.

## 2022-06-19 NOTE — Discharge Instructions (Signed)
RIB FRACTURES  HOME INSTRUCTIONS   PAIN CONTROL:  Pain is best controlled by a usual combination of three different methods TOGETHER:  Ice/Heat Over the counter pain medication Prescription pain medication You may experience some swelling and bruising in area of broken ribs. Ice packs or heating pads (30-60 minutes up to 6 times a day) will help. Use ice for the first few days to help decrease swelling and bruising, then switch to heat to help relax tight/sore spots and speed recovery. Some people prefer to use ice alone, heat alone, alternating between ice & heat. Experiment to what works for you. Swelling and bruising can take several weeks to resolve.  It is helpful to take an over-the-counter pain medication regularly for the first few weeks. Choose one of the following that works best for you:  Naproxen (Aleve, etc) Two 220mg tabs twice a day Ibuprofen (Advil, etc) Three 200mg tabs four times a day (every meal & bedtime) Acetaminophen (Tylenol, etc) 500-650mg four times a day (every meal & bedtime) A prescription for pain medication (such as oxycodone, hydrocodone, etc) may be given to you upon discharge. Take your pain medication as prescribed.  If you are having problems/concerns with the prescription medicine (does not control pain, nausea, vomiting, rash, itching, etc), please call us [phone] to see if we need to switch you to a different pain medicine that will work better for you and/or control your side effect better. If you need a refill on your pain medication, please contact your pharmacy. They will contact our office to request authorization. Prescriptions will not be filled after 5 pm or on week-ends. Avoid getting constipated. When taking pain medications, it is common to experience some constipation. Increasing fluid intake and taking a fiber supplement (such as Metamucil, Citrucel, FiberCon, MiraLax, etc) 1-2 times a day regularly will usually help prevent this problem  from occurring. A mild laxative (prune juice, Milk of Magnesia, MiraLax, etc) should be taken according to package directions if there are no bowel movements after 48 hours.  Watch out for diarrhea. If you have many loose bowel movements, simplify your diet to bland foods & liquids for a few days. Stop any stool softeners and decrease your fiber supplement. Switching to mild anti-diarrheal medications (Kayopectate, Pepto Bismol) can help. If this worsens or does not improve, please call us. FOLLOW UP  If a follow up appointment is needed one will be scheduled for you. If none is needed with our trauma team, please follow up with your primary care provider within 2-3 weeks from discharge. Please call [facility] at [phone] if you have any questions about follow up.  If you have any orthopedic or other injuries you will need to follow up as outlined in your follow up instructions.   WHEN TO CALL US [phone]:  Poor pain control Reactions / problems with new medications (rash/itching, nausea, etc)  Fever over 101.5 F (38.5 C) Worsening swelling or bruising Worsening pain, productive cough, difficulty breathing or any other concerning symptoms  The clinic staff is available to answer your questions during regular business hours (8:30am-5pm). Please don't hesitate to call and ask to speak to one of our nurses for clinical concerns.  If you have a medical emergency, go to the nearest emergency room or call 911.  A surgeon from [facility] is always on call at the hospitals   [facility]  [address], [address], [place], [place] [address] ?  MAIN: [phone] ? TOLL FREE: [phone] ?  FAX [phone]  [email or web address]      Information on Rib Fractures  A rib fracture is a break or crack in one of the bones of the ribs. The ribs are long, curved bones that wrap around your chest and attach to your spine and your breastbone. The  ribs protect your heart, lungs, and other organs in the chest. A broken or cracked rib is often painful but is not usually serious. Most rib fractures heal on their own over time. However, rib fractures can be more serious if multiple ribs are broken or if broken ribs move out of place and push against other structures or organs. What are the causes? This condition is caused by: Repetitive movements with high force, such as pitching a baseball or having severe coughing spells. A direct blow to the chest, such as a sports injury, a car accident, or a fall. Cancer that has spread to the bones, which can weaken bones and cause them to break. What are the signs or symptoms? Symptoms of this condition include: Pain when you breathe in or cough. Pain when someone presses on the injured area. Feeling short of breath. How is this diagnosed? This condition is diagnosed with a physical exam and medical history. Imaging tests may also be done, such as: Chest X-ray. CT scan. MRI. Bone scan. Chest ultrasound. How is this treated? Treatment for this condition depends on the severity of the fracture. Most rib fractures usually heal on their own in 1-3 months. Sometimes healing takes longer if there is a cough that does not stop or if there are other activities that make the injury worse (aggravating factors). While you heal, you will be given medicines to control the pain. You will also be taught deep breathing exercises. Severe injuries may require hospitalization or surgery. Follow these instructions at home: Managing pain, stiffness, and swelling If directed, apply ice to the injured area. Put ice in a plastic bag. Place a towel between your skin and the bag. Leave the ice on for 20 minutes, 2-3 times a day. Take over-the-counter and prescription medicines only as told by your health care provider. Activity Avoid a lot of activity and any activities or movements that cause pain. Be careful during  activities and avoid bumping the injured rib. Slowly increase your activity as told by your health care provider. General instructions Do deep breathing exercises as told by your health care provider. This helps prevent pneumonia, which is a common complication of a broken rib. Your health care provider may instruct you to: Take deep breaths several times a day. Try to cough several times a day, holding a pillow against the injured area. Use a device called incentive spirometer to practice deep breathing several times a day. Drink enough fluid to keep your urine pale yellow. Do not wear a rib belt or binder. These restrict breathing, which can lead to pneumonia. Keep all follow-up visits as told by your health care provider. This is important. Contact a health care provider if: You have a fever. Get help right away if: You have difficulty breathing or you are short of breath. You develop a cough that does not stop, or you cough up thick or bloody sputum. You have nausea, vomiting, or pain in your abdomen. Your pain gets worse and medicine does not help. Summary A rib fracture is a break or crack in one of the bones of the ribs. A broken or cracked rib is   often painful but is not usually serious. Most rib fractures heal on their own over time. Treatment for this condition depends on the severity of the fracture. Avoid a lot of activity and any activities or movements that cause pain. This information is not intended to replace advice given to you by your health care provider. Make sure you discuss any questions you have with your health care provider. Document Released: 03/11/2005 Document Revised: 06/10/2016 Document Reviewed: 06/10/2016 Elsevier Interactive Patient Education  2019 [employer]  

## 2022-06-19 NOTE — TOC CM/SW Note (Cosign Needed Addendum)
Patient is confined to one room and is unable to ambulate to the bathroom, therefore needing a commode at the bedside.    Maudene Stotler W. Orest Dygert, RN, BSN  Trauma/Neuro ICU Case Manager [phone]  

## 2022-06-20 ENCOUNTER — Other Ambulatory Visit (HOSPITAL_BASED_OUTPATIENT_CLINIC_OR_DEPARTMENT_OTHER): Payer: Self-pay

## 2022-06-20 MED ORDER — SODIUM CHLORIDE 1 G PO TABS
1.0000 g | ORAL_TABLET | Freq: Three times a day (TID) | ORAL | 0 refills | Status: AC
  Filled 2022-06-20: qty 9, 3d supply, fill #0

## 2022-06-20 MED ORDER — METHOCARBAMOL 750 MG PO TABS
750.0000 mg | ORAL_TABLET | Freq: Four times a day (QID) | ORAL | 0 refills | Status: AC | PRN
  Filled 2022-06-20: qty 20, 5d supply, fill #0

## 2022-06-20 MED ORDER — ACETAMINOPHEN 325 MG PO TABS: 650.0000 mg | ORAL_TABLET | Freq: Four times a day (QID) | ORAL | Status: AC

## 2022-06-20 MED ORDER — AMLODIPINE BESYLATE 5 MG PO TABS
5.0000 mg | ORAL_TABLET | Freq: Every day | ORAL | 0 refills | Status: DC
  Filled 2022-06-20: qty 14, 14d supply, fill #0

## 2022-06-20 MED ORDER — OXYCODONE HCL 5 MG PO TABS
5.0000 mg | ORAL_TABLET | Freq: Four times a day (QID) | ORAL | 0 refills | Status: AC | PRN
  Filled 2022-06-20: qty 12, 3d supply, fill #0

## 2022-06-20 MED ORDER — IBUPROFEN 200 MG PO TABS: 400.0000 mg | ORAL_TABLET | Freq: Four times a day (QID) | ORAL | Status: AC

## 2022-06-20 NOTE — Progress Notes (Addendum)
Patient is going home with family now.  He says he cannot wait until the equipment gets there.  There is no one at home to accept the equipment.   Notified [place], [place].

## 2022-06-20 NOTE — Plan of Care (Signed)
Pending discharge to home.

## 2022-06-20 NOTE — Plan of Care (Signed)
  Problem: Education: Goal: Knowledge of General Education information will improve Description: Including pain rating scale, medication(s)/side effects and non-pharmacologic comfort measures Outcome: Progressing   Problem: Health Behavior/Discharge Planning: Goal: Ability to manage health-related needs will improve Outcome: Progressing   Problem: Clinical Measurements: Goal: Will remain free from infection Outcome: Progressing Goal: Cardiovascular complication will be avoided Outcome: Progressing   Problem: Activity: Goal: Risk for activity intolerance will decrease Outcome: Progressing   Problem: Nutrition: Goal: Adequate nutrition will be maintained Outcome: Progressing   Problem: Coping: Goal: Level of anxiety will decrease Outcome: Progressing   Problem: Elimination: Goal: Will not experience complications related to bowel motility Outcome: Progressing Goal: Will not experience complications related to urinary retention Outcome: Progressing   Problem: Pain Managment: Goal: General experience of comfort will improve Outcome: Progressing

## 2022-07-02 NOTE — Therapy (Deleted)
OUTPATIENT OCCUPATIONAL THERAPY NEURO EVALUATION  Patient Name: Jamie Campos MRN: [id number] DOB:08/30/1970, 52 y.o., male Today's Date: 07/02/2022  PCP: none REFERRING PROVIDER: Dr. Genevive Bi  END OF SESSION:   Past Medical History:  Diagnosis Date   Acute pancreatitis    Past Surgical History:  Procedure Laterality Date   ROTATOR CUFF REPAIR     Patient Active Problem List   Diagnosis Date Noted   Rib fractures 06/18/2022   Multiple rib fractures 06/16/2022    ONSET DATE: ***  REFERRING DIAG:  Diagnosis  W19.XXXA,Y92.009 (ICD-10-CM) - Fall in home, initial encounter    THERAPY DIAG:    Rationale for Evaluation and Treatment: {HABREHAB:27488}  SUBJECTIVE:   SUBJECTIVE STATEMENT: *** Pt accompanied by: {accompnied:27141}  PERTINENT HISTORY: t is a 52 y.o. M who presents 06/15/2022 after a ground level fall at home. Found to have multiple right rib fractures 7-11 with tiny pneumothorax and small pleural effusion. Significant PMH: alcoholic hepatitis,pt lives with his spouse and son and is self employed as a Naval architect.   PRECAUTIONS: Fall and Other: rib fx  WEIGHT BEARING RESTRICTIONS: {Yes ***/No:24003}  PAIN:  Are you having pain? {OPRCPAIN:27236}  FALLS: Has patient fallen in last 6 months? Yes. Number of falls ***  LIVING ENVIRONMENT: Lives with: {OPRC lives with:25569::"lives with their family"} Lives in: {Lives in:25570} Stairs: {opstairs:27293} Has following equipment at home: {Assistive devices:23999}  PLOF: {PLOF:24004}  PATIENT GOALS: ***  OBJECTIVE:   HAND DOMINANCE: {MISC; OT HAND [phone]}  ADLs: Overall ADLs: *** Transfers/ambulation related to ADLs: Eating: *** Grooming: *** UB Dressing: *** LB Dressing: *** Toileting: *** Bathing: *** Tub Shower transfers: *** Equipment: {equipment:25573}  IADLs: Shopping: *** Light housekeeping: *** Meal Prep: *** Community mobility: *** Medication management:  *** Financial management: *** Handwriting: {OTWRITTENEXPRESSION:25361}  MOBILITY STATUS: {OTMOBILITY:25360}  POSTURE COMMENTS:  {posture:25561} Sitting balance: {sitting balance:25483}  ACTIVITY TOLERANCE: Activity tolerance: ***  FUNCTIONAL OUTCOME MEASURES: {OTFUNCTIONALMEASURES:27238}  UPPER EXTREMITY ROM:    {AROM/PROM:27142} ROM Right eval Left eval  Shoulder flexion    Shoulder abduction    Shoulder adduction    Shoulder extension    Shoulder internal rotation    Shoulder external rotation    Elbow flexion    Elbow extension    Wrist flexion    Wrist extension    Wrist ulnar deviation    Wrist radial deviation    Wrist pronation    Wrist supination    (Blank rows = not tested)  UPPER EXTREMITY MMT:     MMT Right eval Left eval  Shoulder flexion    Shoulder abduction    Shoulder adduction    Shoulder extension    Shoulder internal rotation    Shoulder external rotation    Middle trapezius    Lower trapezius    Elbow flexion    Elbow extension    Wrist flexion    Wrist extension    Wrist ulnar deviation    Wrist radial deviation    Wrist pronation    Wrist supination    (Blank rows = not tested)  HAND FUNCTION: {handfunction:27230}  COORDINATION: {otcoordination:27237}  SENSATION: {sensation:27233}  EDEMA: ***  MUSCLE TONE: {UETONE:25567}  COGNITION: Overall cognitive status: {cognition:24006}  VISION: Subjective report: *** Baseline vision: {OTBASELINEVISION:25363} Visual history: {OTVISUALHISTORY:25364}  VISION ASSESSMENT: {visionassessment:27231}  Patient has difficulty with following activities due to following visual impairments: ***  PERCEPTION: {Perception:25564}  PRAXIS: {Praxis:25565}  OBSERVATIONS: ***   TODAY'S TREATMENT:  DATE: ***   PATIENT EDUCATION: Education details: *** Person  educated: {Person educated:25204} Education method: {Education Method:25205} Education comprehension: {Education Comprehension:25206}  HOME EXERCISE PROGRAM: ***   GOALS: Goals reviewed with patient? {yes/no:20286}  SHORT TERM GOALS: Target date: ***  *** Baseline: Goal status: {GOALSTATUS:25110}  2.  *** Baseline:  Goal status: {GOALSTATUS:25110}  3.  *** Baseline:  Goal status: {GOALSTATUS:25110}  4.  *** Baseline:  Goal status: {GOALSTATUS:25110}  5.  *** Baseline:  Goal status: {GOALSTATUS:25110}  6.  *** Baseline:  Goal status: {GOALSTATUS:25110}  LONG TERM GOALS: Target date: ***  *** Baseline:  Goal status: {GOALSTATUS:25110}  2.  *** Baseline:  Goal status: {GOALSTATUS:25110}  3.  *** Baseline:  Goal status: {GOALSTATUS:25110}  4.  *** Baseline:  Goal status: {GOALSTATUS:25110}  5.  *** Baseline:  Goal status: {GOALSTATUS:25110}  6.  *** Baseline:  Goal status: {GOALSTATUS:25110}  ASSESSMENT:  CLINICAL IMPRESSION: Patient is a *** y.o. *** who was seen today for occupational therapy evaluation for ***.   PERFORMANCE DEFICITS: in functional skills including {OT physical skills:25468}, cognitive skills including {OT cognitive skills:25469}, and psychosocial skills including {OT psychosocial skills:25470}.   IMPAIRMENTS: are limiting patient from {OT performance deficits:25471}.   CO-MORBIDITIES: {Comorbidities:25485} that affects occupational performance. Patient will benefit from skilled OT to address above impairments and improve overall function.  MODIFICATION OR ASSISTANCE TO COMPLETE EVALUATION: {OT modification:25474}  OT OCCUPATIONAL PROFILE AND HISTORY: {OT PROFILE AND HISTORY:25484}  CLINICAL DECISION MAKING: {OT CDM:25475}  REHAB POTENTIAL: {rehabpotential:25112}  EVALUATION COMPLEXITY: {Evaluation complexity:25115}    PLAN:  OT FREQUENCY: {rehab frequency:25116}  OT DURATION: {rehab duration:25117}  PLANNED  INTERVENTIONS: {OT Interventions:25467}  RECOMMENDED OTHER SERVICES: ***  CONSULTED AND AGREED WITH PLAN OF CARE: {VOJ:50093}  PLAN FOR NEXT SESSION: ***   Keene Breath, OT 07/02/2022, 12:56 PM

## 2022-07-04 ENCOUNTER — Other Ambulatory Visit: Payer: Self-pay | Admitting: Family Medicine

## 2022-07-04 ENCOUNTER — Ambulatory Visit: Payer: Managed Care, Other (non HMO) | Admitting: Occupational Therapy

## 2022-07-04 ENCOUNTER — Ambulatory Visit: Payer: Managed Care, Other (non HMO)

## 2022-07-04 DIAGNOSIS — R748 Abnormal levels of other serum enzymes: Secondary | ICD-10-CM

## 2022-07-16 ENCOUNTER — Ambulatory Visit
Admission: RE | Admit: 2022-07-16 | Discharge: 2022-07-16 | Disposition: A | Discharge status: Home / Self Care | Payer: Managed Care, Other (non HMO) | Source: Ambulatory Visit | Attending: Family Medicine | Admitting: Family Medicine

## 2022-07-16 DIAGNOSIS — R748 Abnormal levels of other serum enzymes: Secondary | ICD-10-CM

## 2022-08-02 ENCOUNTER — Other Ambulatory Visit: Payer: Self-pay | Admitting: Family Medicine

## 2022-08-02 ENCOUNTER — Ambulatory Visit
Admission: RE | Admit: 2022-08-02 | Discharge: 2022-08-02 | Disposition: A | Discharge status: Home / Self Care | Payer: Managed Care, Other (non HMO) | Source: Ambulatory Visit | Attending: Family Medicine | Admitting: Family Medicine

## 2022-08-02 DIAGNOSIS — S2241XS Multiple fractures of ribs, right side, sequela: Secondary | ICD-10-CM

## 2022-08-02 DIAGNOSIS — J90 Pleural effusion, not elsewhere classified: Secondary | ICD-10-CM

## 2022-08-06 ENCOUNTER — Other Ambulatory Visit (HOSPITAL_BASED_OUTPATIENT_CLINIC_OR_DEPARTMENT_OTHER): Payer: Self-pay

## 2022-08-06 ENCOUNTER — Inpatient Hospital Stay (HOSPITAL_BASED_OUTPATIENT_CLINIC_OR_DEPARTMENT_OTHER)
Admission: EM | Admit: 2022-08-06 | Discharge: 2022-08-08 | DRG: 194 | Disposition: A | Discharge status: Home / Self Care | Payer: Managed Care, Other (non HMO) | Attending: Internal Medicine | Admitting: Internal Medicine

## 2022-08-06 ENCOUNTER — Encounter (HOSPITAL_BASED_OUTPATIENT_CLINIC_OR_DEPARTMENT_OTHER): Payer: Self-pay | Admitting: Emergency Medicine

## 2022-08-06 ENCOUNTER — Emergency Department (HOSPITAL_BASED_OUTPATIENT_CLINIC_OR_DEPARTMENT_OTHER): Payer: Managed Care, Other (non HMO)

## 2022-08-06 ENCOUNTER — Other Ambulatory Visit: Payer: Self-pay

## 2022-08-06 DIAGNOSIS — F1010 Alcohol abuse, uncomplicated: Secondary | ICD-10-CM | POA: Diagnosis present

## 2022-08-06 DIAGNOSIS — R7989 Other specified abnormal findings of blood chemistry: Secondary | ICD-10-CM | POA: Diagnosis present

## 2022-08-06 DIAGNOSIS — J189 Pneumonia, unspecified organism: Principal | ICD-10-CM | POA: Diagnosis present

## 2022-08-06 DIAGNOSIS — I472 Ventricular tachycardia, unspecified: Secondary | ICD-10-CM | POA: Diagnosis not present

## 2022-08-06 DIAGNOSIS — S2249XA Multiple fractures of ribs, unspecified side, initial encounter for closed fracture: Secondary | ICD-10-CM | POA: Diagnosis present

## 2022-08-06 DIAGNOSIS — Z720 Tobacco use: Secondary | ICD-10-CM | POA: Diagnosis present

## 2022-08-06 DIAGNOSIS — E876 Hypokalemia: Secondary | ICD-10-CM | POA: Diagnosis present

## 2022-08-06 DIAGNOSIS — S2249XD Multiple fractures of ribs, unspecified side, subsequent encounter for fracture with routine healing: Secondary | ICD-10-CM

## 2022-08-06 DIAGNOSIS — Z789 Other specified health status: Secondary | ICD-10-CM | POA: Diagnosis present

## 2022-08-06 DIAGNOSIS — Z79899 Other long term (current) drug therapy: Secondary | ICD-10-CM

## 2022-08-06 DIAGNOSIS — I10 Essential (primary) hypertension: Secondary | ICD-10-CM | POA: Diagnosis present

## 2022-08-06 DIAGNOSIS — F17210 Nicotine dependence, cigarettes, uncomplicated: Secondary | ICD-10-CM | POA: Diagnosis present

## 2022-08-06 DIAGNOSIS — Z1152 Encounter for screening for COVID-19: Secondary | ICD-10-CM

## 2022-08-06 DIAGNOSIS — R0902 Hypoxemia: Secondary | ICD-10-CM | POA: Diagnosis present

## 2022-08-06 DIAGNOSIS — F1090 Alcohol use, unspecified, uncomplicated: Secondary | ICD-10-CM | POA: Diagnosis present

## 2022-08-06 DIAGNOSIS — W19XXXD Unspecified fall, subsequent encounter: Secondary | ICD-10-CM | POA: Diagnosis present

## 2022-08-06 LAB — CBC WITH DIFFERENTIAL/PLATELET
Abs Immature Granulocytes: 0.04 10*3/uL (ref 0.00–0.07)
Basophils Absolute: 0 10*3/uL (ref 0.0–0.1)
Basophils Relative: 0 %
Eosinophils Absolute: 0 10*3/uL (ref 0.0–0.5)
Eosinophils Relative: 1 %
HCT: 34.6 % — ABNORMAL LOW (ref 39.0–52.0)
Hemoglobin: 12.6 g/dL — ABNORMAL LOW (ref 13.0–17.0)
Immature Granulocytes: 1 %
Lymphocytes Relative: 11 %
Lymphs Abs: 0.7 10*3/uL (ref 0.7–4.0)
MCH: 35.1 pg — ABNORMAL HIGH (ref 26.0–34.0)
MCHC: 36.4 g/dL — ABNORMAL HIGH (ref 30.0–36.0)
MCV: 96.4 fL (ref 80.0–100.0)
Monocytes Absolute: 0.9 10*3/uL (ref 0.1–1.0)
Monocytes Relative: 15 %
Neutro Abs: 4.7 10*3/uL (ref 1.7–7.7)
Neutrophils Relative %: 72 %
Platelets: 171 10*3/uL (ref 150–400)
RBC: 3.59 MIL/uL — ABNORMAL LOW (ref 4.22–5.81)
RDW: 13 % (ref 11.5–15.5)
WBC: 6.5 10*3/uL (ref 4.0–10.5)
nRBC: 0 % (ref 0.0–0.2)

## 2022-08-06 LAB — BASIC METABOLIC PANEL
Anion gap: 16 — ABNORMAL HIGH (ref 5–15)
BUN: 5 mg/dL — ABNORMAL LOW (ref 6–20)
CO2: 21 mmol/L — ABNORMAL LOW (ref 22–32)
Calcium: 8.4 mg/dL — ABNORMAL LOW (ref 8.9–10.3)
Chloride: 96 mmol/L — ABNORMAL LOW (ref 98–111)
Creatinine, Ser: 0.81 mg/dL (ref 0.61–1.24)
GFR, Estimated: >60 mL/min (ref >60)
Glucose, Bld: 90 mg/dL (ref 70–99)
Potassium: 3.7 mmol/L (ref 3.5–5.1)
Sodium: 133 mmol/L — ABNORMAL LOW (ref 135–145)

## 2022-08-06 LAB — BRAIN NATRIURETIC PEPTIDE: B Natriuretic Peptide: 19.2 pg/mL (ref 0.0–100.0)

## 2022-08-06 LAB — TROPONIN I (HIGH SENSITIVITY)
Troponin I (High Sensitivity): 7 ng/L (ref <18)
Troponin I (High Sensitivity): 7 ng/L (ref <18)

## 2022-08-06 MED ORDER — LORAZEPAM 2 MG/ML IJ SOLN
1.0000 mg | Freq: Once | INTRAMUSCULAR | Status: AC
  Administered 2022-08-06: 1 mg via INTRAVENOUS
  Filled 2022-08-06: qty 1

## 2022-08-06 MED ORDER — SODIUM CHLORIDE 0.9 % IV SOLN
500.0000 mg | Freq: Once | INTRAVENOUS | Status: AC
  Administered 2022-08-06: 500 mg via INTRAVENOUS
  Filled 2022-08-06: qty 5

## 2022-08-06 MED ORDER — SODIUM CHLORIDE 0.9 % IV SOLN
2.0000 g | INTRAVENOUS | Status: DC
  Administered 2022-08-07: 2 g via INTRAVENOUS
  Filled 2022-08-06: qty 20

## 2022-08-06 MED ORDER — ALBUTEROL SULFATE (2.5 MG/3ML) 0.083% IN NEBU: 2.5000 mg | INHALATION_SOLUTION | RESPIRATORY_TRACT | Status: DC | PRN

## 2022-08-06 MED ORDER — IOHEXOL 350 MG/ML SOLN
100.0000 mL | Freq: Once | INTRAVENOUS | Status: AC | PRN
  Administered 2022-08-06: 100 mL via INTRAVENOUS

## 2022-08-06 MED ORDER — TRAZODONE HCL 50 MG PO TABS
25.0000 mg | ORAL_TABLET | Freq: Every evening | ORAL | Status: DC | PRN
  Administered 2022-08-07: 25 mg via ORAL
  Filled 2022-08-06: qty 1

## 2022-08-06 MED ORDER — SODIUM CHLORIDE 0.9 % IV BOLUS
500.0000 mL | Freq: Once | INTRAVENOUS | Status: AC
  Administered 2022-08-06: 500 mL via INTRAVENOUS

## 2022-08-06 MED ORDER — LORAZEPAM 1 MG PO TABS
1.0000 mg | ORAL_TABLET | ORAL | Status: DC | PRN
  Administered 2022-08-07: 1 mg via ORAL
  Filled 2022-08-06 (×2): qty 1

## 2022-08-06 MED ORDER — ALBUTEROL SULFATE HFA 108 (90 BASE) MCG/ACT IN AERS: 2.0000 | INHALATION_SPRAY | RESPIRATORY_TRACT | Status: DC | PRN

## 2022-08-06 MED ORDER — ACETAMINOPHEN 650 MG RE SUPP: 650.0000 mg | Freq: Four times a day (QID) | RECTAL | Status: DC | PRN

## 2022-08-06 MED ORDER — SODIUM CHLORIDE 0.9% FLUSH: 3.0000 mL | INTRAVENOUS | Status: DC | PRN

## 2022-08-06 MED ORDER — SODIUM CHLORIDE 0.9 % IV SOLN
500.0000 mg | INTRAVENOUS | Status: DC
  Administered 2022-08-07: 500 mg via INTRAVENOUS
  Filled 2022-08-06: qty 5

## 2022-08-06 MED ORDER — METOPROLOL TARTRATE 5 MG/5ML IV SOLN
5.0000 mg | Freq: Four times a day (QID) | INTRAVENOUS | Status: DC | PRN
  Administered 2022-08-07: 5 mg via INTRAVENOUS
  Filled 2022-08-06: qty 5

## 2022-08-06 MED ORDER — SODIUM CHLORIDE 0.9 % IV SOLN: 250.0000 mL | INTRAVENOUS | Status: DC | PRN

## 2022-08-06 MED ORDER — ENOXAPARIN SODIUM 40 MG/0.4ML IJ SOSY
40.0000 mg | PREFILLED_SYRINGE | INTRAMUSCULAR | Status: DC
  Administered 2022-08-07 – 2022-08-08 (×2): 40 mg via SUBCUTANEOUS
  Filled 2022-08-06 (×2): qty 0.4

## 2022-08-06 MED ORDER — FOLIC ACID 1 MG PO TABS
1.0000 mg | ORAL_TABLET | Freq: Every day | ORAL | Status: DC
  Administered 2022-08-06 – 2022-08-08 (×3): 1 mg via ORAL
  Filled 2022-08-06 (×3): qty 1

## 2022-08-06 MED ORDER — LORAZEPAM 1 MG PO TABS
1.0000 mg | ORAL_TABLET | ORAL | Status: DC | PRN
  Administered 2022-08-06 – 2022-08-08 (×5): 1 mg via ORAL
  Filled 2022-08-06 (×4): qty 1

## 2022-08-06 MED ORDER — ONDANSETRON HCL 4 MG/2ML IJ SOLN: 4.0000 mg | Freq: Four times a day (QID) | INTRAMUSCULAR | Status: DC | PRN

## 2022-08-06 MED ORDER — THIAMINE MONONITRATE 100 MG PO TABS
100.0000 mg | ORAL_TABLET | Freq: Every day | ORAL | Status: DC
  Administered 2022-08-06 – 2022-08-08 (×3): 100 mg via ORAL
  Filled 2022-08-06 (×3): qty 1

## 2022-08-06 MED ORDER — ADULT MULTIVITAMIN W/MINERALS CH
1.0000 | ORAL_TABLET | Freq: Every day | ORAL | Status: DC
  Administered 2022-08-06 – 2022-08-08 (×3): 1 via ORAL
  Filled 2022-08-06 (×3): qty 1

## 2022-08-06 MED ORDER — DOXYCYCLINE HYCLATE 100 MG PO TABS
100.0000 mg | ORAL_TABLET | Freq: Once | ORAL | Status: AC
  Administered 2022-08-06: 100 mg via ORAL
  Filled 2022-08-06: qty 1

## 2022-08-06 MED ORDER — ONDANSETRON HCL 4 MG PO TABS: 4.0000 mg | ORAL_TABLET | Freq: Four times a day (QID) | ORAL | Status: DC | PRN

## 2022-08-06 MED ORDER — MORPHINE SULFATE (PF) 4 MG/ML IV SOLN
4.0000 mg | Freq: Once | INTRAVENOUS | Status: AC
  Administered 2022-08-06: 4 mg via INTRAVENOUS
  Filled 2022-08-06: qty 1

## 2022-08-06 MED ORDER — FENTANYL CITRATE PF 50 MCG/ML IJ SOSY
50.0000 ug | PREFILLED_SYRINGE | Freq: Once | INTRAMUSCULAR | Status: AC
  Administered 2022-08-06: 50 ug via INTRAVENOUS
  Filled 2022-08-06: qty 1

## 2022-08-06 MED ORDER — POLYETHYLENE GLYCOL 3350 17 G PO PACK: 17.0000 g | PACK | Freq: Every day | ORAL | Status: DC | PRN

## 2022-08-06 MED ORDER — THIAMINE HCL 100 MG/ML IJ SOLN: 100.0000 mg | Freq: Every day | INTRAMUSCULAR | Status: DC

## 2022-08-06 MED ORDER — SODIUM CHLORIDE 0.9 % IV SOLN
1.0000 g | Freq: Once | INTRAVENOUS | Status: AC
  Administered 2022-08-06: 1 g via INTRAVENOUS
  Filled 2022-08-06: qty 10

## 2022-08-06 MED ORDER — ACETAMINOPHEN 325 MG PO TABS
650.0000 mg | ORAL_TABLET | Freq: Four times a day (QID) | ORAL | Status: DC | PRN
  Administered 2022-08-07 (×2): 650 mg via ORAL
  Filled 2022-08-06 (×2): qty 2

## 2022-08-06 MED ORDER — CEFUROXIME AXETIL 500 MG PO TABS
500.0000 mg | ORAL_TABLET | Freq: Two times a day (BID) | ORAL | 0 refills | Status: DC
  Filled 2022-08-06: qty 20, 10d supply, fill #0

## 2022-08-06 MED ORDER — AMLODIPINE BESYLATE 5 MG PO TABS
5.0000 mg | ORAL_TABLET | Freq: Every day | ORAL | Status: DC
  Administered 2022-08-07 – 2022-08-08 (×2): 5 mg via ORAL
  Filled 2022-08-06 (×2): qty 1

## 2022-08-06 MED ORDER — SODIUM CHLORIDE 0.9% FLUSH
3.0000 mL | Freq: Two times a day (BID) | INTRAVENOUS | Status: DC
  Administered 2022-08-07 – 2022-08-08 (×4): 3 mL via INTRAVENOUS

## 2022-08-06 NOTE — ED Notes (Signed)
Pt ambulatory to bathroom, no assistance needed.  O2 sats noted to be 88% on RA upon pts return to pt room.  EDP Belfi at bedside, made aware.

## 2022-08-06 NOTE — H&P (Signed)
  History and Physical    Patient: Jamie Campos XBJ:[id number] DOB: [birth year] DOA: 08/06/2022 DOS: the patient was seen and examined on 08/06/2022 PCP: Johny Blamer, MD  Patient coming from: {Point_of_Origin:26777}  Chief Complaint:  Chief Complaint  Patient presents with   Shortness of Breath   HPI: Jamie Campos is a 52 y.o. male with medical history significant of ***  Review of Systems: {ROS_Text:26778} Past Medical History:  Diagnosis Date   Acute pancreatitis    Past Surgical History:  Procedure Laterality Date   ROTATOR CUFF REPAIR     Social History:  reports that he has been smoking. He has been smoking an average of 1 pack per day. He has never used smokeless tobacco. He reports current alcohol use of about 3.0 standard drinks of alcohol per week. He reports that he does not use drugs.  No Known Allergies  History reviewed. No pertinent family history.  Prior to Admission medications   Medication Sig Start Date End Date Taking? Authorizing Provider  amLODipine (NORVASC) 5 MG tablet Take 1 tablet (5 mg total) by mouth daily for 14 days. 06/20/22 07/04/22  Eric Form, PA-C  cefUROXime (CEFTIN) 500 MG tablet Take 1 tablet (500 mg total) by mouth every 12 (twelve) hours fro 10 days. 08/06/22       Physical Exam: Vitals:   08/06/22 2006 08/06/22 2130 08/06/22 2223 08/06/22 2224  BP:  (!) 144/87  (!) 160/96  Pulse:  (!) 112  (!) 115  Resp:  (!) 26  20  Temp:    98.6 F (37 C)  TempSrc:    Oral  SpO2: 95% 97% 100% 97%  Weight:   81.3 kg   Height:   5\' 9"  (1.753 m)    *** Data Reviewed: {Tip this will not be part of the note when signed- Document your independent interpretation of telemetry tracing, EKG, lab, Radiology test or any other diagnostic tests. Add any new diagnostic test ordered today. (Optional):26781} {Results:26384}  Assessment and Plan: No notes have been filed under this hospital service. Service: Hospitalist     Advance Care  Planning:   Code Status: Full Code ***  Consults: ***  Family Communication: ***  Severity of Illness: {Observation/Inpatient:21159}  Author: Reva Bores, MD 08/06/2022 11:26 PM  For on call review [email or web address].

## 2022-08-06 NOTE — Progress Notes (Addendum)
Plan of Care Note for accepted transfer   Patient: Jamie Campos MRN: [id number]   DOA: 08/06/2022  Facility requesting transfer: [facility] Requesting Provider: Rolan Bucco, MD Reason for transfer: Community-acquired pneumonia  Facility course:   [name] is a 52 year old male with past medical history significant for HTN and EtOH abuse who presented to [facility] ED on 5/14 with shortness of breath x 3 days associated with cough.  Patient was noted to be afebrile, although tachycardic, tachypneic with exertional hypoxia of 87-88% on ambulation.  WBC count 6.5, high sensitive troponin 7, BNP 19.2.  Chest x-ray with bibasilar airspace opacities.  CT angiogram chest negative for pulm embolism, noted displaced fractures left eighth-11th posterior ribs with callus formation, left lower lobe consolidation with air bronchograms concerning for pneumonia.  Patient was recently admitted to the trauma service following fall with multiple rib fractures, small pneumothorax with pleural effusion with hospital course complicated by EtOH withdrawal.  Was admitted from 06/15/2022 through 06/20/2022.  Patient was started on azithromycin and ceftriaxone.  Patient also will be initiated on CIWA protocol due to previous history of alcohol withdrawal.   Plan of care: The patient is accepted for admission to Telemetry unit, at [facility]..    Author: Alvira Philips [place], DO 08/06/2022  Check [email or web address] for on-call coverage.  Nursing staff, Please call [facility] Admits & Consults System-Wide number on Amion as soon as patient's arrival, so appropriate admitting provider can evaluate the pt.

## 2022-08-06 NOTE — ED Notes (Signed)
Lab notified to add-on BNP to previously collected blood  sample.   

## 2022-08-06 NOTE — ED Provider Notes (Signed)
Patient is a 52 year old male who initially presented with cough and some left-sided chest pain.  He had a recent admission for multiple rib fractures and pneumothorax.  CT scan shows evidence of pneumonia.  No pneumothorax.  No evidence of PE.  He was started on IV Rocephin and Zithromax.  He walked to the restroom and became hypoxic with oxygen saturations of 87 to 88% on room air.  At rest, he is still a bit tachypneic with respiratory rate in the high 20s, low 30s.  He does not have any increased work of breathing.  His oxygen saturation is around 92 or 93 on room air at rest.  He is tachycardic with heart rates in the 100s to 110s.  Given this, feel that patient would likely benefit from inpatient hospitalization for treatment of his pneumonia.  He currently does not have any tremors.  He did drink alcohol last night and says he does drink daily.  His last drink was last night.  Will start CIWA protocol.  I spoke with Dr. [place] who will admit the patient for further treatment.   Rolan Bucco, MD 08/06/22 [phone]

## 2022-08-06 NOTE — ED Notes (Signed)
Patient transported to X-ray 

## 2022-08-06 NOTE — ED Provider Notes (Signed)
[place] [facility] Provider Note   CSN: [id number] Arrival date & time: 08/06/22  1120     History  Chief Complaint  Patient presents with   Shortness of Breath    Jamie Campos is a 52 y.o. male.  HPI   52 year old male with past medical history of traumatic right rib fractures and pneumothorax about 2 months ago presents emergency department left-sided chest pain.  Patient is nonspecific and a poor historian.  He states over the past 3 days has been having worsening shortness of breath, nonproductive cough and left-sided chest pain.  The pain is mainly with deep breaths.  Denies any fever.  He feels short of breath with exertion.  He denies any swelling of his lower extremities or history of DVT/PE.  States that he has healed well from the previous fall and has not had any complications.  Home Medications Prior to Admission medications   Medication Sig Start Date End Date Taking? Authorizing Provider  amLODipine (NORVASC) 5 MG tablet Take 1 tablet (5 mg total) by mouth daily for 14 days. 06/20/22 07/04/22  Eric Form, PA-C      Allergies    Patient has no known allergies.    Review of Systems   Review of Systems  Constitutional:  Positive for fatigue. Negative for fever.  Respiratory:  Positive for cough and shortness of breath.   Cardiovascular:  Positive for chest pain. Negative for palpitations and leg swelling.  Gastrointestinal:  Negative for abdominal pain, diarrhea and vomiting.  Genitourinary:  Positive for flank pain.  Musculoskeletal:  Negative for back pain.  Skin:  Negative for rash.  Neurological:  Negative for headaches.    Physical Exam Updated Vital Signs BP 121/79   Pulse (!) 105   Temp 98.2 F (36.8 C) (Oral)   Resp (!) 26   SpO2 90%  Physical Exam Vitals and nursing note reviewed.  Constitutional:      General: He is not in acute distress.    Appearance: Normal appearance. He is not  ill-appearing.  HENT:     Head: Normocephalic.     Mouth/Throat:     Mouth: Mucous membranes are moist.  Cardiovascular:     Rate and Rhythm: Normal rate.  Pulmonary:     Effort: Pulmonary effort is normal. Tachypnea present. No respiratory distress.     Breath sounds: Examination of the right-middle field reveals rales. Examination of the left-middle field reveals rales. Examination of the right-lower field reveals decreased breath sounds. Examination of the left-lower field reveals decreased breath sounds. Decreased breath sounds and rales present.  Abdominal:     Palpations: Abdomen is soft.     Tenderness: There is no abdominal tenderness.  Musculoskeletal:     Right lower leg: No edema.     Left lower leg: No edema.  Skin:    General: Skin is warm.  Neurological:     Mental Status: He is alert and oriented to person, place, and time. Mental status is at baseline.  Psychiatric:        Mood and Affect: Mood normal.     ED Results / Procedures / Treatments   Labs (all labs ordered are listed, but only abnormal results are displayed) Labs Reviewed  BASIC METABOLIC PANEL - Abnormal; Notable for the following components:      Result Value   Sodium 133 (*)    Chloride 96 (*)    CO2 21 (*)  BUN 5 (*)    Calcium 8.4 (*)    Anion gap 16 (*)    All other components within normal limits  CBC WITH DIFFERENTIAL/PLATELET - Abnormal; Notable for the following components:   RBC 3.59 (*)    Hemoglobin 12.6 (*)    HCT 34.6 (*)    MCH 35.1 (*)    MCHC 36.4 (*)    All other components within normal limits  CBC WITH DIFFERENTIAL/PLATELET  BRAIN NATRIURETIC PEPTIDE  TROPONIN I (HIGH SENSITIVITY)  TROPONIN I (HIGH SENSITIVITY)    EKG EKG Interpretation  Date/Time:  Tuesday Aug 06 2022 11:29:09 EDT Ventricular Rate:  129 PR Interval:  147 QRS Duration: 76 QT Interval:  292 QTC Calculation: 428 R Axis:   28 Text Interpretation: Sinus tachycardia Left atrial enlargement  Confirmed by Coralee Pesa [phone]) on 08/06/2022 11:50:16 AM  Radiology DG Chest 2 View  Result Date: 08/06/2022 CLINICAL DATA:  Shortness of breath EXAM: CHEST - 2 VIEW COMPARISON:  CXR 08/02/22 FINDINGS: Hazy bibasilar airspace opacities could represent atelectasis or infection. Low lung volumes. Unchanged cardiac and mediastinal contours. No radiographically apparent displaced rib fractures. Visualized upper abdomen is unremarkable. Vertebral body heights are maintained. IMPRESSION: Hazy bibasilar airspace opacities could represent atelectasis, but are worrisome for infection. Electronically Signed   By: Lorenza Cambridge M.D.   On: 08/06/2022 12:15    Procedures Procedures    Medications Ordered in ED Medications  albuterol (VENTOLIN HFA) 108 (90 Base) MCG/ACT inhaler 2 puff (has no administration in time range)  sodium chloride 0.9 % bolus 500 mL (has no administration in time range)  morphine (PF) 4 MG/ML injection 4 mg (4 mg Intravenous Given 08/06/22 1335)    ED Course/ Medical Decision Making/ A&P                             Medical Decision Making Amount and/or Complexity of Data Reviewed Labs: ordered. Radiology: ordered.  Risk Prescription drug management.   52 year old male presents emergency department with left-sided chest pain, shortness of breath and nonproductive cough.  He is tachycardic and tachypneic on arrival, no hypoxia.  Conversationally dyspneic but no respiratory distress on room air.  EKG shows sinus tachycardia.  Blood work is reassuring, no leukocytosis, initial troponin is normal at 7.  Chest x-ray shows hazy bilateral opacities concerning for atelectasis or possible infection.  However also of concern with the tachycardia and recent hospital admission would be rule out possible PE.  BNP is pending.  But we will pursue CT study to rule out PE and further characterize lung findings.  Patient signed out pending CT.        Final Clinical Impression(s)  / ED Diagnoses Final diagnoses:  None    Rx / DC Orders ED Discharge Orders     None         Rozelle Logan, DO 08/06/22 1500

## 2022-08-06 NOTE — ED Notes (Signed)
[facility] at bedside 

## 2022-08-06 NOTE — ED Notes (Signed)
Pt transported to CT ?

## 2022-08-06 NOTE — ED Notes (Signed)
Pt called out stating he did not want to be admitted and that he wanted to go home because he would "be more comfortable at home." Dr. Fredderick Phenix informed and at bedside discussing the importance of admission vs risks of leaving AMA.    Pts O2 level dropped to 91%. RR-28. Pt placed on 2L [place]. O2 increased to 95%.

## 2022-08-06 NOTE — ED Triage Notes (Signed)
Patient presents to ED via POV from home. Here with shortness of breath x 3 days. Also reports cough.

## 2022-08-06 NOTE — ED Notes (Signed)
Called [facility] for Transport to [facility] @6 :01pm.  Spoke with [employer]

## 2022-08-06 NOTE — ED Notes (Signed)
Attempted report to receiving unit, no answer.  

## 2022-08-07 DIAGNOSIS — F1090 Alcohol use, unspecified, uncomplicated: Secondary | ICD-10-CM | POA: Diagnosis not present

## 2022-08-07 DIAGNOSIS — E876 Hypokalemia: Secondary | ICD-10-CM | POA: Diagnosis present

## 2022-08-07 DIAGNOSIS — I472 Ventricular tachycardia, unspecified: Secondary | ICD-10-CM | POA: Diagnosis not present

## 2022-08-07 DIAGNOSIS — R0902 Hypoxemia: Secondary | ICD-10-CM | POA: Diagnosis present

## 2022-08-07 DIAGNOSIS — Z1152 Encounter for screening for COVID-19: Secondary | ICD-10-CM | POA: Diagnosis not present

## 2022-08-07 DIAGNOSIS — J189 Pneumonia, unspecified organism: Secondary | ICD-10-CM | POA: Diagnosis not present

## 2022-08-07 DIAGNOSIS — I10 Essential (primary) hypertension: Secondary | ICD-10-CM | POA: Diagnosis present

## 2022-08-07 DIAGNOSIS — F1010 Alcohol abuse, uncomplicated: Secondary | ICD-10-CM | POA: Diagnosis present

## 2022-08-07 DIAGNOSIS — F17210 Nicotine dependence, cigarettes, uncomplicated: Secondary | ICD-10-CM | POA: Diagnosis present

## 2022-08-07 DIAGNOSIS — S2249XD Multiple fractures of ribs, unspecified side, subsequent encounter for fracture with routine healing: Secondary | ICD-10-CM | POA: Diagnosis not present

## 2022-08-07 DIAGNOSIS — Z79899 Other long term (current) drug therapy: Secondary | ICD-10-CM | POA: Diagnosis not present

## 2022-08-07 DIAGNOSIS — W19XXXD Unspecified fall, subsequent encounter: Secondary | ICD-10-CM | POA: Diagnosis present

## 2022-08-07 DIAGNOSIS — Z720 Tobacco use: Secondary | ICD-10-CM | POA: Diagnosis present

## 2022-08-07 LAB — CBC
HCT: 34 % — ABNORMAL LOW (ref 39.0–52.0)
Hemoglobin: 12 g/dL — ABNORMAL LOW (ref 13.0–17.0)
MCH: 34.8 pg — ABNORMAL HIGH (ref 26.0–34.0)
MCHC: 35.3 g/dL (ref 30.0–36.0)
MCV: 98.6 fL (ref 80.0–100.0)
Platelets: 155 10*3/uL (ref 150–400)
RBC: 3.45 MIL/uL — ABNORMAL LOW (ref 4.22–5.81)
RDW: 12.9 % (ref 11.5–15.5)
WBC: 5.7 10*3/uL (ref 4.0–10.5)
nRBC: 0 % (ref 0.0–0.2)

## 2022-08-07 LAB — RESPIRATORY PANEL BY PCR

## 2022-08-07 LAB — COMPREHENSIVE METABOLIC PANEL
ALT: 27 U/L (ref 0–44)
AST: 26 U/L (ref 15–41)
Albumin: 3.2 g/dL — ABNORMAL LOW (ref 3.5–5.0)
Alkaline Phosphatase: 81 U/L (ref 38–126)
Anion gap: 10 (ref 5–15)
BUN: 6 mg/dL (ref 6–20)
CO2: 27 mmol/L (ref 22–32)
Calcium: 8.4 mg/dL — ABNORMAL LOW (ref 8.9–10.3)
Chloride: 97 mmol/L — ABNORMAL LOW (ref 98–111)
Creatinine, Ser: 0.92 mg/dL (ref 0.61–1.24)
GFR, Estimated: >60 mL/min (ref >60)
Glucose, Bld: 98 mg/dL (ref 70–99)
Potassium: 3.1 mmol/L — ABNORMAL LOW (ref 3.5–5.1)
Sodium: 134 mmol/L — ABNORMAL LOW (ref 135–145)
Total Bilirubin: 1.4 mg/dL — ABNORMAL HIGH (ref 0.3–1.2)
Total Protein: 7.9 g/dL (ref 6.5–8.1)

## 2022-08-07 LAB — EXPECTORATED SPUTUM ASSESSMENT W GRAM STAIN, RFLX TO RESP C

## 2022-08-07 LAB — PROCALCITONIN: Procalcitonin: <0.1 ng/mL

## 2022-08-07 LAB — PROTIME-INR
INR: 1 (ref 0.8–1.2)
Prothrombin Time: 13.1 seconds (ref 11.4–15.2)

## 2022-08-07 LAB — STREP PNEUMONIAE URINARY ANTIGEN: Strep Pneumo Urinary Antigen: NEGATIVE

## 2022-08-07 LAB — HIV ANTIBODY (ROUTINE TESTING W REFLEX): HIV Screen 4th Generation wRfx: NONREACTIVE

## 2022-08-07 LAB — MAGNESIUM: Magnesium: 1.5 mg/dL — ABNORMAL LOW (ref 1.7–2.4)

## 2022-08-07 LAB — SARS CORONAVIRUS 2 BY RT PCR: SARS Coronavirus 2 by RT PCR: NEGATIVE

## 2022-08-07 MED ORDER — LIDOCAINE 5 % EX PTCH
2.0000 | MEDICATED_PATCH | CUTANEOUS | Status: DC
  Administered 2022-08-07: 2 via TRANSDERMAL
  Filled 2022-08-07 (×2): qty 2

## 2022-08-07 MED ORDER — POTASSIUM CHLORIDE CRYS ER 20 MEQ PO TBCR
40.0000 meq | EXTENDED_RELEASE_TABLET | Freq: Once | ORAL | Status: AC
  Administered 2022-08-07: 40 meq via ORAL
  Filled 2022-08-07: qty 2

## 2022-08-07 MED ORDER — CARVEDILOL 3.125 MG PO TABS
3.1250 mg | ORAL_TABLET | Freq: Two times a day (BID) | ORAL | Status: DC
  Administered 2022-08-07 – 2022-08-08 (×2): 3.125 mg via ORAL
  Filled 2022-08-07 (×2): qty 1

## 2022-08-07 MED ORDER — NICOTINE 21 MG/24HR TD PT24
21.0000 mg | MEDICATED_PATCH | Freq: Every day | TRANSDERMAL | Status: DC
  Administered 2022-08-07 – 2022-08-08 (×2): 21 mg via TRANSDERMAL
  Filled 2022-08-07 (×2): qty 1

## 2022-08-07 MED ORDER — SODIUM CHLORIDE 0.9 % IV SOLN: INTRAVENOUS | Status: DC

## 2022-08-07 MED ORDER — MAGNESIUM SULFATE 2 GM/50ML IV SOLN
2.0000 g | Freq: Once | INTRAVENOUS | Status: AC
  Administered 2022-08-07: 2 g via INTRAVENOUS
  Filled 2022-08-07: qty 50

## 2022-08-07 NOTE — Hospital Course (Signed)
Jamie Campos is a 52 y.o. male with medical history significant of HTN, heavy alcohol use, with recent fall and traumatic rib fractures who is admitted in March and discharged around that time.  He developed a cough approximately 3 days prior to admission and just not feeling well.  He reports associated shortness of breath and left-sided chest pain.  He denies significant fever or sick contacts.  In the ED was noted to be hypoxic as well as to have new pneumonia and we are asked to admit for this.

## 2022-08-07 NOTE — Assessment & Plan Note (Signed)
-  Nicotine patch 

## 2022-08-07 NOTE — Assessment & Plan Note (Signed)
Likely related to ongoing alcohol use Counseled about need to cut back and reduce

## 2022-08-07 NOTE — Assessment & Plan Note (Signed)
Rocephin, azithromycin Pulmonary toilet Oxygen as needed

## 2022-08-07 NOTE — Progress Notes (Signed)
   08/07/22 1712  Vitals  Temp (!) 101.5 F (38.6 C)  Temp Source Oral  BP (!) 145/92  MAP (mmHg) 107  Patient Position (if appropriate) Sitting  Pulse Rate (!) 128  Resp (!) 30  MEWS COLOR  MEWS Score Color Red  Oxygen Therapy  SpO2 96 %  O2 Device Room Air  MEWS Score  MEWS Temp 2  MEWS Systolic 0  MEWS Pulse 2  MEWS RR 2  MEWS LOC 0  MEWS Score 6  Provider Notification  Provider Name/Title Marlin Canary, DO  Date Provider Notified 08/07/22  Time Provider Notified 1713  Method of Notification Page  Notification Reason Change in status  Provider response See new orders  Date of Provider Response 08/07/22  Time of Provider Response 1713  Rapid Response Notification  Name of Rapid Response RN Notified Ethan P.  Date Rapid Response Notified 08/07/22  Time Rapid Response Notified 1800

## 2022-08-07 NOTE — Progress Notes (Signed)
PROGRESS NOTE    JOHANTHAN Campos  ZOX:[id number] DOB: 19-Sep-1970 DOA: 08/06/2022 PCP: Johny Blamer, MD    Brief Narrative:  Jamie Campos is a 52 y.o. male with medical history significant of HTN, heavy alcohol use, with recent fall and traumatic rib fractures who is admitted in March and discharged around that time.  He developed a cough approximately 3 days prior to admission and just not feeling well.  He reports associated shortness of breath and left-sided chest pain.  He denies significant fever or sick contacts.  In the ED was noted to be hypoxic as well as to have new pneumonia and we are asked to admit for this.    Assessment and Plan: * Community acquired pneumonia Rocephin, azithromycin Pulmonary toilet Oxygen-wean to off  Tobacco use Nicotine patch -encourage cessation  Alcohol use CIWA protocol  Abnormal LFTs Likely related to ongoing alcohol use Counseled about need to cut back and reduce  Multiple rib fractures Healing well according to x-ray -incentive spirometry -lidocaine patch  Hypokalemia -replete  DVT prophylaxis: enoxaparin (LOVENOX) injection 40 mg Start: 08/07/22 1000    Code Status: Full Code   Disposition Plan:  Level of care: Med-Surg Status is: Observation The patient will require care spanning > 2 midnights and should be moved to inpatient because: wean off O2    Consultants:  none   Subjective: Still having some pain with deep breathing  Objective: Vitals:   08/07/22 0207 08/07/22 0220 08/07/22 0610 08/07/22 0920  BP: (!) 156/90 (!) 188/96 (!) 151/89 138/87  Pulse: (!) 107  100 92  Resp: 20  20   Temp: 99.7 F (37.6 C)  (!) 100.5 F (38.1 C) 98.8 F (37.1 C)  TempSrc: Oral  Oral Oral  SpO2: 97%  93% 99%  Weight:      Height:        Intake/Output Summary (Last 24 hours) at 08/07/2022 0959 Last data filed at 08/06/2022 1904 Gross per 24 hour  Intake 850 ml  Output --  Net 850 ml   Filed Weights   08/06/22  2223  Weight: 81.3 kg    Examination:   General: Appearance:     Overweight male in no acute distress     Lungs:     On [place] coarse breath sounds, respirations unlabored  Heart:    Normal heart rate. Normal rhythm. No murmurs, rubs, or gallops.    MS:   All extremities are intact.    Neurologic:   Awake, alert, oriented x 3. No apparent focal neurological           defect.        Data Reviewed: I have personally reviewed following labs and imaging studies  CBC: Recent Labs  Lab 08/06/22 1415 08/07/22 0457  WBC 6.5 5.7  NEUTROABS 4.7  --   HGB 12.6* 12.0*  HCT 34.6* 34.0*  MCV 96.4 98.6  PLT 171 155   Basic Metabolic Panel: Recent Labs  Lab 08/06/22 1338 08/07/22 0457  NA 133* 134*  K 3.7 3.1*  CL 96* 97*  CO2 21* 27  GLUCOSE 90 98  BUN 5* 6  CREATININE 0.81 0.92  CALCIUM 8.4* 8.4*   GFR: Estimated Creatinine Clearance: 97.1 mL/min (by C-G formula based on SCr of 0.92 mg/dL). Liver Function Tests: Recent Labs  Lab 08/07/22 0457  AST 26  ALT 27  ALKPHOS 81  BILITOT 1.4*  PROT 7.9  ALBUMIN 3.2*   No results for input(s): "LIPASE", "AMYLASE"  in the last 168 hours. No results for input(s): "AMMONIA" in the last 168 hours. Coagulation Profile: Recent Labs  Lab 08/07/22 0457  INR 1.0   Cardiac Enzymes: No results for input(s): "CKTOTAL", "CKMB", "CKMBINDEX", "TROPONINI" in the last 168 hours. BNP (last 3 results) No results for input(s): "PROBNP" in the last 8760 hours. HbA1C: No results for input(s): "HGBA1C" in the last 72 hours. CBG: No results for input(s): "GLUCAP" in the last 168 hours. Lipid Profile: No results for input(s): "CHOL", "HDL", "LDLCALC", "TRIG", "CHOLHDL", "LDLDIRECT" in the last 72 hours. Thyroid Function Tests: No results for input(s): "TSH", "T4TOTAL", "FREET4", "T3FREE", "THYROIDAB" in the last 72 hours. Anemia Panel: No results for input(s): "VITAMINB12", "FOLATE", "FERRITIN", "TIBC", "IRON", "RETICCTPCT" in the last 72  hours. Sepsis Labs: Recent Labs  Lab 08/07/22 0457  PROCALCITON <0.10    Recent Results (from the past 240 hour(s))  Expectorated Sputum Assessment w Gram Stain, Rflx to Resp Cult     Status: None   Collection Time: 08/07/22  7:08 AM   Specimen: Expectorated Sputum  Result Value Ref Range Status   Specimen Description EXPECTORATED SPUTUM  Final   Special Requests NONE  Final   Sputum evaluation   Final    THIS SPECIMEN IS ACCEPTABLE FOR SPUTUM CULTURE Performed at [facility], [address], [place], [place] [address]    Report Status 08/07/2022 FINAL  Final         Radiology Studies: CT Angio Chest PE W/Cm &/Or Wo Cm  Result Date: 08/06/2022 CLINICAL DATA:  Shortness of breath for 3 days. Cough, pulmonary embolism suspected. Enteritis EXAM: CT ANGIOGRAPHY CHEST WITH CONTRAST TECHNIQUE: Multidetector CT imaging of the chest was performed using the standard protocol during bolus administration of intravenous contrast. Multiplanar CT image reconstructions and MIPs were obtained to evaluate the vascular anatomy. RADIATION DOSE REDUCTION: This exam was performed according to the departmental dose-optimization program which includes automated exposure control, adjustment of the mA and/or kV according to patient size and/or use of iterative reconstruction technique. CONTRAST:  OMNIPAQUE IOHEXOL 350 MG/ML SOLN COMPARISON:  None Available. FINDINGS: Cardiovascular: Satisfactory opacification of the pulmonary arteries to the segmental level. No evidence of pulmonary embolism. Normal heart size. No pericardial effusion. Mediastinum/Nodes: No enlarged mediastinal, hilar, or axillary lymph nodes. Thyroid gland, trachea, and esophagus demonstrate no significant findings. Lungs/Pleura: Left lower lobe consolidation with air bronchograms concerning for pneumonia. Small left pleural effusion. There is also subsegmental linear atelectasis in the right lower lobe adjacent to the  multiple posterior rib fractures. Upper Abdomen: No acute abnormality. Musculoskeletal: Displaced fractures of the left 8-11 posterior ribs with callus formation suggesting a subacute process. Review of the MIP images confirms the above findings. IMPRESSION: 1. No evidence of pulmonary embolism. 2. Displaced fractures of the left 8-11th posterior ribs with callus formation suggesting a subacute process. 3. Left lower lobe consolidation with air bronchograms concerning for pneumonia. Small left pleural effusion. Follow-up examination to resolution is recommended. 4. Subsegmental linear atelectasis in the right lower lobe adjacent to the multiple posterior rib fractures. This may be sequela of infectious/inflammatory process or atelectasis secondary to impaired inspiration due to rib fractures. Electronically Signed   By: Larose Hires D.O.   On: 08/06/2022 15:59   DG Chest 2 View  Result Date: 08/06/2022 CLINICAL DATA:  Shortness of breath EXAM: CHEST - 2 VIEW COMPARISON:  CXR 08/02/22 FINDINGS: Hazy bibasilar airspace opacities could represent atelectasis or infection. Low lung volumes. Unchanged cardiac and mediastinal  contours. No radiographically apparent displaced rib fractures. Visualized upper abdomen is unremarkable. Vertebral body heights are maintained. IMPRESSION: Hazy bibasilar airspace opacities could represent atelectasis, but are worrisome for infection. Electronically Signed   By: Lorenza Cambridge M.D.   On: 08/06/2022 12:15        Scheduled Meds:  amLODipine  5 mg Oral Daily   enoxaparin (LOVENOX) injection  40 mg Subcutaneous Q24H   folic acid  1 mg Oral Daily   lidocaine  2 patch Transdermal Q24H   multivitamin with minerals  1 tablet Oral Daily   nicotine  21 mg Transdermal Daily   sodium chloride flush  3 mL Intravenous Q12H   thiamine  100 mg Oral Daily   Or   thiamine  100 mg Intravenous Daily   Continuous Infusions:  sodium chloride     azithromycin     cefTRIAXone  (ROCEPHIN)  IV       LOS: 0 days    Time spent: 45 minutes spent on chart review, discussion with nursing staff, consultants, updating family and interview/physical exam; more than 50% of that time was spent in counseling and/or coordination of care.    Joseph Art, DO [facility] Available via Epic secure chat 7am-7pm After these hours, please refer to coverage provider listed on [email or web address] 08/07/2022, 9:59 AM

## 2022-08-07 NOTE — TOC Initial Note (Signed)
Transition of Care [facility]) - Initial/Assessment Note    Patient Details  Name: Jamie Campos MRN: [id number] Date of Birth: 1970-06-10  Transition of Care [facility] CM/SW Contact:    Lanier Clam, RN Phone Number: 08/07/2022, 12:28 PM  Clinical Narrative: Provided tobacco/etoh resources to AVS.                    Barriers to Discharge: Continued Medical Work up   Patient Goals and CMS Choice Patient states their goals for this hospitalization and ongoing recovery are:: Home CMS Medicare.gov Compare Post Acute Care list provided to:: Patient Choice offered to / list presented to : Patient [place] ownership interest in [facility].provided to:: Patient    Expected Discharge Plan and Services   Discharge Planning Services: CM Consult   Living arrangements for the past 2 months: Single Family Home                                      Prior Living Arrangements/Services Living arrangements for the past 2 months: Single Family Home Lives with:: Self Patient language and need for interpreter reviewed:: Yes Do you feel safe going back to the place where you live?: Yes      Need for Family Participation in Patient Care: Yes (Comment) Care giver support system in place?: Yes (comment)   Criminal Activity/Legal Involvement Pertinent to Current Situation/Hospitalization: No - Comment as needed  Activities of Daily Living Home Assistive Devices/Equipment: None ADL Screening (condition at time of admission) Patient's cognitive ability adequate to safely complete daily activities?: Yes Is the patient deaf or have difficulty hearing?: No Does the patient have difficulty seeing, even when wearing glasses/contacts?: No Patient able to express need for assistance with ADLs?: Yes Does the patient have difficulty dressing or bathing?: No Independently performs ADLs?: Yes (appropriate for developmental age) Does the patient have difficulty walking or climbing stairs?:  No Weakness of Legs: Left Weakness of Arms/Hands: None  Permission Sought/Granted Permission sought to share information with : Case Manager Permission granted to share information with : Yes, Verbal Permission Granted  Share Information with NAME: Case manager           Emotional Assessment Appearance:: Appears stated age Attitude/Demeanor/Rapport: Gracious Affect (typically observed): Accepting Orientation: : Oriented to Self, Oriented to Place, Oriented to  Time, Oriented to Situation Alcohol / Substance Use: Alcohol Use, Tobacco Use Psych Involvement: No (comment)  Admission diagnosis:  Community acquired pneumonia [J18.9] Alcohol use disorder [F10.90] Community acquired pneumonia, unspecified laterality [J18.9] Pneumonia [J18.9] Patient Active Problem List   Diagnosis Date Noted   Tobacco use 08/07/2022   Pneumonia 08/07/2022   Community acquired pneumonia 08/06/2022   Abnormal LFTs 08/06/2022   Alcohol use 08/06/2022   Rib fractures 06/18/2022   Multiple rib fractures 06/16/2022   PCP:  Johny Blamer, MD Pharmacy:   [facility] [address] [place] [address] Phone: [phone] Fax: [phone]     Social Determinants of Health (SDOH) Social History: SDOH Screenings   Food Insecurity: No Food Insecurity (08/06/2022)  Housing: Low Risk  (08/06/2022)  Transportation Needs: No Transportation Needs (08/06/2022)  Utilities: Not At Risk (08/06/2022)  Tobacco Use: High Risk (08/06/2022)   SDOH Interventions:     Readmission Risk Interventions     No data to display

## 2022-08-07 NOTE — Assessment & Plan Note (Signed)
Healing well according to x-ray

## 2022-08-07 NOTE — Progress Notes (Signed)
Patient ambulated to restroom. RN receives phone call from telemetry about HR in 160s. Patient back to bed and vitals rechecked at this time.

## 2022-08-07 NOTE — Assessment & Plan Note (Signed)
-   CIWA protocol 

## 2022-08-08 ENCOUNTER — Other Ambulatory Visit (HOSPITAL_COMMUNITY): Payer: Self-pay

## 2022-08-08 DIAGNOSIS — J189 Pneumonia, unspecified organism: Secondary | ICD-10-CM | POA: Diagnosis not present

## 2022-08-08 DIAGNOSIS — F1090 Alcohol use, unspecified, uncomplicated: Secondary | ICD-10-CM | POA: Diagnosis not present

## 2022-08-08 LAB — BASIC METABOLIC PANEL
Anion gap: 11 (ref 5–15)
BUN: 7 mg/dL (ref 6–20)
CO2: 22 mmol/L (ref 22–32)
Calcium: 8.3 mg/dL — ABNORMAL LOW (ref 8.9–10.3)
Chloride: 98 mmol/L (ref 98–111)
Creatinine, Ser: 0.85 mg/dL (ref 0.61–1.24)
GFR, Estimated: >60 mL/min (ref >60)
Glucose, Bld: 105 mg/dL — ABNORMAL HIGH (ref 70–99)
Potassium: 3.3 mmol/L — ABNORMAL LOW (ref 3.5–5.1)
Sodium: 131 mmol/L — ABNORMAL LOW (ref 135–145)

## 2022-08-08 LAB — CULTURE, RESPIRATORY W GRAM STAIN

## 2022-08-08 LAB — MAGNESIUM: Magnesium: 1.7 mg/dL (ref 1.7–2.4)

## 2022-08-08 LAB — PHOSPHORUS: Phosphorus: 2 mg/dL — ABNORMAL LOW (ref 2.5–4.6)

## 2022-08-08 MED ORDER — MAGNESIUM SULFATE 2 GM/50ML IV SOLN
2.0000 g | Freq: Once | INTRAVENOUS | Status: AC
  Administered 2022-08-08: 2 g via INTRAVENOUS
  Filled 2022-08-08: qty 50

## 2022-08-08 MED ORDER — SODIUM CHLORIDE 0.9 % IV SOLN
2.0000 g | INTRAVENOUS | Status: DC
  Administered 2022-08-08: 2 g via INTRAVENOUS
  Filled 2022-08-08: qty 20

## 2022-08-08 MED ORDER — DOXYCYCLINE HYCLATE 100 MG PO CAPS
100.0000 mg | ORAL_CAPSULE | Freq: Two times a day (BID) | ORAL | 0 refills | Status: DC
  Filled 2022-08-08: qty 10, 5d supply, fill #0

## 2022-08-08 MED ORDER — CARVEDILOL 3.125 MG PO TABS
3.1250 mg | ORAL_TABLET | Freq: Two times a day (BID) | ORAL | 0 refills | Status: DC
  Filled 2022-08-08: qty 60, 30d supply, fill #0

## 2022-08-08 MED ORDER — LIDOCAINE 5 % EX PTCH
2.0000 | MEDICATED_PATCH | CUTANEOUS | 0 refills | Status: DC
  Filled 2022-08-08 (×2): qty 30, 15d supply, fill #0

## 2022-08-08 MED ORDER — POTASSIUM PHOSPHATES 15 MMOLE/5ML IV SOLN
30.0000 mmol | Freq: Once | INTRAVENOUS | Status: AC
  Administered 2022-08-08: 30 mmol via INTRAVENOUS
  Filled 2022-08-08: qty 10

## 2022-08-08 MED ORDER — SODIUM CHLORIDE 0.9 % IV SOLN
500.0000 mg | INTRAVENOUS | Status: DC
  Administered 2022-08-08: 500 mg via INTRAVENOUS
  Filled 2022-08-08: qty 5

## 2022-08-08 MED ORDER — NICOTINE 21 MG/24HR TD PT24
21.0000 mg | MEDICATED_PATCH | Freq: Every day | TRANSDERMAL | 0 refills | Status: DC
  Filled 2022-08-08: qty 28, 28d supply, fill #0

## 2022-08-08 NOTE — Discharge Summary (Signed)
Physician Discharge Summary  Jamie Campos UJW:[id number] DOB: Dec 31, 1970 DOA: 08/06/2022  PCP: Johny Blamer, MD  Admit date: 08/06/2022 Discharge date: 08/08/2022  Admitted From: home Discharge disposition: home   Recommendations for Outpatient Follow-Up:   Patient feeling better and wants to go home despite advice to stay-- to facilitate a safe d/c will send home with abx Will need outpatient follow up to ensure resolution of PNA Bmp at next office visit   Discharge Diagnosis:   Principal Problem:   Community acquired pneumonia Active Problems:   Multiple rib fractures   Abnormal LFTs   Alcohol use   Tobacco use   Pneumonia    Discharge Condition: Improved.  Diet recommendation:   low salt  Wound care: None.  Code status: Full.   History of Present Illness:   Jamie Campos is a 52 y.o. male with medical history significant of HTN, heavy alcohol use, with recent fall and traumatic rib fractures who is admitted in March and discharged around that time.  He developed a cough approximately 3 days prior to admission and just not feeling well.  He reports associated shortness of breath and left-sided chest pain.  He denies significant fever or sick contacts.  In the ED was noted to be hypoxic as well as to have new pneumonia and we are asked to admit for this    Hospital Course by Problem:   Community acquired pneumonia Rocephin, azithromycin-- change to PO Pulmonary toilet Oxygen-weaned off   Tobacco use Nicotine patch -encourage cessation   Alcohol use CIWA protocol   Abnormal LFTs Likely related to ongoing alcohol use Counseled about need to cut back and reduce   Multiple rib fractures Healing well according to x-ray -incentive spirometry -lidocaine patch   Hypokalemia/hypomagnesemia/hypophosphatemia -replete -short runs of v tac in hospital-- BB added     Medical Consultants:      Discharge Exam:   Vitals:   08/08/22  0800 08/08/22 0951  BP:  132/80  Pulse: (!) 126 (!) 113  Resp:  19  Temp:  98.3 F (36.8 C)  SpO2:  95%   Vitals:   08/08/22 0132 08/08/22 0557 08/08/22 0800 08/08/22 0951  BP: 128/89 124/83  132/80  Pulse: (!) 123 (!) 123 (!) 126 (!) 113  Resp: 20 20  19   Temp: 98.2 F (36.8 C) 98 F (36.7 C)  98.3 F (36.8 C)  TempSrc: Oral   Oral  SpO2: 93% 96%  95%  Weight:      Height:        General exam: Appears calm and comfortable.    The results of significant diagnostics from this hospitalization (including imaging, microbiology, ancillary and laboratory) are listed below for reference.     Procedures and Diagnostic Studies:   CT Angio Chest PE W/Cm &/Or Wo Cm  Result Date: 08/06/2022 CLINICAL DATA:  Shortness of breath for 3 days. Cough, pulmonary embolism suspected. Enteritis EXAM: CT ANGIOGRAPHY CHEST WITH CONTRAST TECHNIQUE: Multidetector CT imaging of the chest was performed using the standard protocol during bolus administration of intravenous contrast. Multiplanar CT image reconstructions and MIPs were obtained to evaluate the vascular anatomy. RADIATION DOSE REDUCTION: This exam was performed according to the departmental dose-optimization program which includes automated exposure control, adjustment of the mA and/or kV according to patient size and/or use of iterative reconstruction technique. CONTRAST:  OMNIPAQUE IOHEXOL 350 MG/ML SOLN COMPARISON:  None Available. FINDINGS: Cardiovascular: Satisfactory opacification of the pulmonary arteries to the  segmental level. No evidence of pulmonary embolism. Normal heart size. No pericardial effusion. Mediastinum/Nodes: No enlarged mediastinal, hilar, or axillary lymph nodes. Thyroid gland, trachea, and esophagus demonstrate no significant findings. Lungs/Pleura: Left lower lobe consolidation with air bronchograms concerning for pneumonia. Small left pleural effusion. There is also subsegmental linear atelectasis in the right lower  lobe adjacent to the multiple posterior rib fractures. Upper Abdomen: No acute abnormality. Musculoskeletal: Displaced fractures of the left 8-11 posterior ribs with callus formation suggesting a subacute process. Review of the MIP images confirms the above findings. IMPRESSION: 1. No evidence of pulmonary embolism. 2. Displaced fractures of the left 8-11th posterior ribs with callus formation suggesting a subacute process. 3. Left lower lobe consolidation with air bronchograms concerning for pneumonia. Small left pleural effusion. Follow-up examination to resolution is recommended. 4. Subsegmental linear atelectasis in the right lower lobe adjacent to the multiple posterior rib fractures. This may be sequela of infectious/inflammatory process or atelectasis secondary to impaired inspiration due to rib fractures. Electronically Signed   By: Larose Hires D.O.   On: 08/06/2022 15:59   DG Chest 2 View  Result Date: 08/06/2022 CLINICAL DATA:  Shortness of breath EXAM: CHEST - 2 VIEW COMPARISON:  CXR 08/02/22 FINDINGS: Hazy bibasilar airspace opacities could represent atelectasis or infection. Low lung volumes. Unchanged cardiac and mediastinal contours. No radiographically apparent displaced rib fractures. Visualized upper abdomen is unremarkable. Vertebral body heights are maintained. IMPRESSION: Hazy bibasilar airspace opacities could represent atelectasis, but are worrisome for infection. Electronically Signed   By: Lorenza [place] M.D.   On: 08/06/2022 12:15     Labs:   Basic Metabolic Panel: Recent Labs  Lab 08/06/22 1338 08/07/22 0457 08/07/22 1425 08/08/22 0500  NA 133* 134*  --  131*  K 3.7 3.1*  --  3.3*  CL 96* 97*  --  98  CO2 21* 27  --  22  GLUCOSE 90 98  --  105*  BUN 5* 6  --  7  CREATININE 0.81 0.92  --  0.85  CALCIUM 8.4* 8.4*  --  8.3*  MG  --   --  1.5* 1.7  PHOS  --   --   --  2.0*   GFR Estimated Creatinine Clearance: 105.1 mL/min (by C-G formula based on SCr of 0.85  mg/dL). Liver Function Tests: Recent Labs  Lab 08/07/22 0457  AST 26  ALT 27  ALKPHOS 81  BILITOT 1.4*  PROT 7.9  ALBUMIN 3.2*   No results for input(s): "LIPASE", "AMYLASE" in the last 168 hours. No results for input(s): "AMMONIA" in the last 168 hours. Coagulation profile Recent Labs  Lab 08/07/22 0457  INR 1.0    CBC: Recent Labs  Lab 08/06/22 1415 08/07/22 0457  WBC 6.5 5.7  NEUTROABS 4.7  --   HGB 12.6* 12.0*  HCT 34.6* 34.0*  MCV 96.4 98.6  PLT 171 155   Cardiac Enzymes: No results for input(s): "CKTOTAL", "CKMB", "CKMBINDEX", "TROPONINI" in the last 168 hours. BNP: Invalid input(s): "POCBNP" CBG: No results for input(s): "GLUCAP" in the last 168 hours. D-Dimer No results for input(s): "DDIMER" in the last 72 hours. Hgb A1c No results for input(s): "HGBA1C" in the last 72 hours. Lipid Profile No results for input(s): "CHOL", "HDL", "LDLCALC", "TRIG", "CHOLHDL", "LDLDIRECT" in the last 72 hours. Thyroid function studies No results for input(s): "TSH", "T4TOTAL", "T3FREE", "THYROIDAB" in the last 72 hours.  Invalid input(s): "FREET3" Anemia work up No results for input(s): "VITAMINB12", "FOLATE", "FERRITIN", "TIBC", "  IRON", "RETICCTPCT" in the last 72 hours. Microbiology Recent Results (from the past 240 hour(s))  Expectorated Sputum Assessment w Gram Stain, Rflx to Resp Cult     Status: None   Collection Time: 08/07/22  7:08 AM   Specimen: Expectorated Sputum  Result Value Ref Range Status   Specimen Description EXPECTORATED SPUTUM  Final   Special Requests NONE  Final   Sputum evaluation   Final    THIS SPECIMEN IS ACCEPTABLE FOR SPUTUM CULTURE Performed at [facility], [address], [place], [place] [address]    Report Status 08/07/2022 FINAL  Final  Culture, Respiratory w Gram Stain     Status: None (Preliminary result)   Collection Time: 08/07/22  7:08 AM  Result Value Ref Range Status   Specimen Description   Final     EXPECTORATED SPUTUM Performed at [facility], [address] [place], [place] [address]    Special Requests   Final    NONE Reflexed from [id number] Performed at [facility], [address], [place], [place] [address]    Gram Stain   Final    RARE WBC PRESENT,BOTH PMN AND MONONUCLEAR RARE GRAM POSITIVE COCCI IN PAIRS    Culture   Final    CULTURE REINCUBATED FOR BETTER GROWTH Performed at [facility], [address], [place], [place] [address]    Report Status PENDING  Incomplete  Respiratory (~20 pathogens) panel by PCR     Status: None   Collection Time: 08/07/22  5:36 PM   Specimen: Nasopharyngeal Swab; Respiratory  Result Value Ref Range Status   Adenovirus NOT DETECTED NOT DETECTED Final   Coronavirus 229E NOT DETECTED NOT DETECTED Final    Comment: (NOTE) The Coronavirus on the Respiratory Panel, DOES NOT test for the novel  Coronavirus (2019 nCoV)    Coronavirus HKU1 NOT DETECTED NOT DETECTED Final   Coronavirus NL63 NOT DETECTED NOT DETECTED Final   Coronavirus OC43 NOT DETECTED NOT DETECTED Final   Metapneumovirus NOT DETECTED NOT DETECTED Final   Rhinovirus / Enterovirus NOT DETECTED NOT DETECTED Final   Influenza A NOT DETECTED NOT DETECTED Final   Influenza B NOT DETECTED NOT DETECTED Final   Parainfluenza Virus 1 NOT DETECTED NOT DETECTED Final   Parainfluenza Virus 2 NOT DETECTED NOT DETECTED Final   Parainfluenza Virus 3 NOT DETECTED NOT DETECTED Final   Parainfluenza Virus 4 NOT DETECTED NOT DETECTED Final   Respiratory Syncytial Virus NOT DETECTED NOT DETECTED Final   Bordetella pertussis NOT DETECTED NOT DETECTED Final   Bordetella Parapertussis NOT DETECTED NOT DETECTED Final   Chlamydophila pneumoniae NOT DETECTED NOT DETECTED Final   Mycoplasma pneumoniae NOT DETECTED NOT DETECTED Final    Comment: Performed at [facility], [address], [place], [place] [address]  SARS Coronavirus 2 by RT PCR  (hospital order, performed in [facility] hospital lab) *cepheid single result test* Anterior Nasal Swab     Status: None   Collection Time: 08/07/22  5:36 PM   Specimen: Anterior Nasal Swab  Result Value Ref Range Status   SARS Coronavirus 2 by RT PCR NEGATIVE NEGATIVE Final    Comment: (NOTE) SARS-CoV-2 target nucleic acids are NOT DETECTED.  The SARS-CoV-2 RNA is generally detectable in upper and lower respiratory specimens during the acute phase of infection. The lowest concentration of SARS-CoV-2 viral copies this assay can detect is 250 copies / mL. A negative result does not preclude SARS-CoV-2 infection and  should not be used as the sole basis for treatment or other patient management decisions.  A negative result may occur with improper specimen collection / handling, submission of specimen other than nasopharyngeal swab, presence of viral mutation(s) within the areas targeted by this assay, and inadequate number of viral copies (<250 copies / mL). A negative result must be combined with clinical observations, patient history, and epidemiological information.  Fact Sheet for Patients:   RoadLapTop.co.za  Fact Sheet for Healthcare Providers: http://kim-miller.com/  This test is not yet approved or  cleared by the [place] FDA and has been authorized for detection and/or diagnosis of SARS-CoV-2 by FDA under an Emergency Use Authorization (EUA).  This EUA will remain in effect (meaning this test can be used) for the duration of the COVID-19 declaration under Section 564(b)(1) of the Act, 21 U.S.C. section 360bbb-3(b)(1), unless the authorization is terminated or revoked sooner.  Performed at [facility], [address] [place], [place] [address]      Discharge Instructions:   Discharge Instructions     Diet - low sodium heart healthy   Complete by: As directed    Increase activity slowly    Complete by: As directed       Allergies as of 08/08/2022   No Known Allergies      Medication List     TAKE these medications    amLODipine 5 MG tablet Commonly known as: NORVASC Take 1 tablet (5 mg total) by mouth daily for 14 days.   carvedilol 3.125 MG tablet Commonly known as: COREG Take 1 tablet (3.125 mg total) by mouth 2 (two) times daily with a meal.   doxycycline 100 MG capsule Commonly known as: VIBRAMYCIN Take 1 capsule (100 mg total) by mouth 2 (two) times daily.   lidocaine 5 % Commonly known as: LIDODERM Place 2 patches onto the skin daily. Remove & Discard patch within 12 hours or as directed by MD   nicotine 21 mg/24hr patch Commonly known as: NICODERM CQ - dosed in mg/24 hours Place 1 patch (21 mg total) onto the skin daily. Start taking on: Aug 09, 2022          Time coordinating discharge: 45 min  Signed:  Joseph Art DO  [facility] 08/08/2022, 11:27 AM

## 2022-08-08 NOTE — TOC Transition Note (Signed)
Transition of Care [facility]) - CM/SW Discharge Note   Patient Details  Name: Jamie Campos MRN: [id number] Date of Birth: [birth year]  [facility]) CM/SW Contact:  Lanier Clam, RN Phone Number: 08/08/2022, 11:43 AM   Clinical Narrative:  d/c home. No further CM needs.     Final next level of care: Home/Self Care Barriers to Discharge: No Barriers Identified   Patient Goals and CMS Choice CMS Medicare.gov Compare Post Acute Care list provided to:: Patient Choice offered to / list presented to : Patient  Discharge Placement                         Discharge Plan and Services Additional resources added to the After Visit Summary for     Discharge Planning Services: CM Consult                                 Social Determinants of Health (SDOH) Interventions SDOH Screenings   Food Insecurity: No Food Insecurity (08/06/2022)  Housing: Low Risk  (08/06/2022)  Transportation Needs: No Transportation Needs (08/06/2022)  Utilities: Not At Risk (08/06/2022)  Tobacco Use: High Risk (08/06/2022)     Readmission Risk Interventions     No data to display

## 2022-08-08 NOTE — Progress Notes (Signed)
Per Telemetry patient with 23  beats of Vtach, patient assessed and resting in bed without any c/o.will notify NP.

## 2022-08-08 NOTE — Plan of Care (Signed)
  Problem: Education: Goal: Knowledge of General Education information will improve Description Including pain rating scale, medication(s)/side effects and non-pharmacologic comfort measures Outcome: Progressing   Problem: Health Behavior/Discharge Planning: Goal: Ability to manage health-related needs will improve Outcome: Progressing   Problem: Clinical Measurements: Goal: Ability to maintain clinical measurements within normal limits will improve Outcome: Progressing Goal: Will remain free from infection Outcome: Progressing Goal: Diagnostic test results will improve Outcome: Progressing Goal: Respiratory complications will improve Outcome: Progressing Goal: Cardiovascular complication will be avoided Outcome: Progressing   Problem: Activity: Goal: Risk for activity intolerance will decrease Outcome: Progressing   Problem: Nutrition: Goal: Adequate nutrition will be maintained Outcome: Progressing   Problem: Coping: Goal: Level of anxiety will decrease Outcome: Progressing   Problem: Elimination: Goal: Will not experience complications related to bowel motility Outcome: Progressing Goal: Will not experience complications related to urinary retention Outcome: Progressing   Problem: Pain Managment: Goal: General experience of comfort will improve Outcome: Progressing   Problem: Safety: Goal: Ability to remain free from injury will improve Outcome: Progressing   Problem: Skin Integrity: Goal: Risk for impaired skin integrity will decrease Outcome: Progressing   Problem: Respiratory: Goal: Ability to maintain adequate ventilation will improve Outcome: Progressing Goal: Ability to maintain a clear airway will improve Outcome: Progressing   

## 2022-08-08 NOTE — Progress Notes (Signed)
Additional labs ordered. Mag/Phos.

## 2022-08-09 ENCOUNTER — Other Ambulatory Visit (HOSPITAL_COMMUNITY): Payer: Self-pay

## 2022-08-09 LAB — LEGIONELLA PNEUMOPHILA SEROGP 1 UR AG: L. pneumophila Serogp 1 Ur Ag: NEGATIVE

## 2022-08-10 LAB — CULTURE, RESPIRATORY W GRAM STAIN

## 2022-08-20 ENCOUNTER — Other Ambulatory Visit: Payer: Self-pay | Admitting: Family Medicine

## 2022-08-20 ENCOUNTER — Ambulatory Visit
Admission: RE | Admit: 2022-08-20 | Discharge: 2022-08-20 | Disposition: A | Discharge status: Home / Self Care | Payer: Managed Care, Other (non HMO) | Source: Ambulatory Visit | Attending: Family Medicine | Admitting: Family Medicine

## 2022-08-20 DIAGNOSIS — Z8701 Personal history of pneumonia (recurrent): Secondary | ICD-10-CM

## 2022-08-21 ENCOUNTER — Other Ambulatory Visit (HOSPITAL_BASED_OUTPATIENT_CLINIC_OR_DEPARTMENT_OTHER): Payer: Self-pay

## 2022-08-21 MED ORDER — LEVOFLOXACIN 750 MG PO TABS
750.0000 mg | ORAL_TABLET | Freq: Every day | ORAL | 0 refills | Status: DC
  Filled 2022-08-21: qty 7, 7d supply, fill #0

## 2022-09-10 ENCOUNTER — Other Ambulatory Visit: Payer: Self-pay

## 2022-09-10 ENCOUNTER — Encounter: Payer: Self-pay | Admitting: Medical Oncology

## 2022-09-10 ENCOUNTER — Inpatient Hospital Stay: Payer: Managed Care, Other (non HMO) | Admitting: Medical Oncology

## 2022-09-10 ENCOUNTER — Inpatient Hospital Stay: Payer: Managed Care, Other (non HMO) | Attending: Family

## 2022-09-10 VITALS — BP 108/60 | HR 97 | Temp 97.6°F | Resp 18 | Ht 69.0 in | Wt 178.0 lb

## 2022-09-10 DIAGNOSIS — Z789 Other specified health status: Secondary | ICD-10-CM

## 2022-09-10 DIAGNOSIS — F17210 Nicotine dependence, cigarettes, uncomplicated: Secondary | ICD-10-CM

## 2022-09-10 DIAGNOSIS — F1010 Alcohol abuse, uncomplicated: Secondary | ICD-10-CM | POA: Insufficient documentation

## 2022-09-10 DIAGNOSIS — D649 Anemia, unspecified: Secondary | ICD-10-CM | POA: Diagnosis present

## 2022-09-10 DIAGNOSIS — R161 Splenomegaly, not elsewhere classified: Secondary | ICD-10-CM

## 2022-09-10 LAB — CBC WITH DIFFERENTIAL (CANCER CENTER ONLY)
Abs Immature Granulocytes: 0.01 10*3/uL (ref 0.00–0.07)
Basophils Absolute: 0 10*3/uL (ref 0.0–0.1)
Basophils Relative: 1 %
Eosinophils Absolute: 0.2 10*3/uL (ref 0.0–0.5)
Eosinophils Relative: 7 %
HCT: 37.6 % — ABNORMAL LOW (ref 39.0–52.0)
Hemoglobin: 13.1 g/dL (ref 13.0–17.0)
Immature Granulocytes: 0 %
Lymphocytes Relative: 46 %
Lymphs Abs: 1.1 10*3/uL (ref 0.7–4.0)
MCH: 34.8 pg — ABNORMAL HIGH (ref 26.0–34.0)
MCHC: 34.8 g/dL (ref 30.0–36.0)
MCV: 100 fL (ref 80.0–100.0)
Monocytes Absolute: 0.4 10*3/uL (ref 0.1–1.0)
Monocytes Relative: 14 %
Neutro Abs: 0.8 10*3/uL — ABNORMAL LOW (ref 1.7–7.7)
Neutrophils Relative %: 32 %
Platelet Count: 191 10*3/uL (ref 150–400)
RBC: 3.76 MIL/uL — ABNORMAL LOW (ref 4.22–5.81)
RDW: 13.8 % (ref 11.5–15.5)
WBC Count: 2.4 10*3/uL — ABNORMAL LOW (ref 4.0–10.5)
nRBC: 0 % (ref 0.0–0.2)

## 2022-09-10 LAB — FERRITIN: Ferritin: 369 ng/mL — ABNORMAL HIGH (ref 24–336)

## 2022-09-10 LAB — CMP (CANCER CENTER ONLY)
ALT: 20 U/L (ref 0–44)
AST: 32 U/L (ref 15–41)
Albumin: 3.5 g/dL (ref 3.5–5.0)
Alkaline Phosphatase: 100 U/L (ref 38–126)
Anion gap: 12 (ref 5–15)
BUN: 7 mg/dL (ref 6–20)
CO2: 22 mmol/L (ref 22–32)
Calcium: 8.3 mg/dL — ABNORMAL LOW (ref 8.9–10.3)
Chloride: 103 mmol/L (ref 98–111)
Creatinine: 0.86 mg/dL (ref 0.61–1.24)
GFR, Estimated: >60 mL/min (ref >60)
Glucose, Bld: 134 mg/dL — ABNORMAL HIGH (ref 70–99)
Potassium: 3.4 mmol/L — ABNORMAL LOW (ref 3.5–5.1)
Sodium: 137 mmol/L (ref 135–145)
Total Bilirubin: 0.5 mg/dL (ref 0.3–1.2)
Total Protein: 8.1 g/dL (ref 6.5–8.1)

## 2022-09-10 LAB — VITAMIN B12: Vitamin B-12: 230 pg/mL (ref 180–914)

## 2022-09-10 LAB — FOLATE: Folate: 4.9 ng/mL — ABNORMAL LOW (ref >5.9)

## 2022-09-10 NOTE — Addendum Note (Signed)
Addended by: Clent Jacks on: 09/10/2022 02:39 PM   Modules accepted: Level of Service

## 2022-09-10 NOTE — Progress Notes (Signed)
[facility] [phone]   [phone]) [phone]  INITIAL CONSULT NOTE  Patient Care Team: Noberto Retort, MD as PCP - General (Family Medicine)  CHIEF COMPLAINTS/PURPOSE OF CONSULTATION:  Anemia and elevated platelets   HISTORY OF PRESENTING ILLNESS:  Jamie Campos 51 y.o. male who is referred to [place] by his PCP Dr. Jackquline Bosch for anemia which has been present for the past ~2 months. He is here with his wife.   He was also referred for a colon cancer screening as of 08/26/2022. He has an visit with GI on 09/30/2022.   Has a history of nutritional deficiencies ( Phosphorus, magnesium, sodium, potassium) secondary to ETOH abuse.   Blood in stools:Yes Blood in urine: No Difficulty swallowing: No Pica: Yes SOB: Some Fatigue: Yes Change of bowel movement/constipation: No Prior blood transfusion: No Prior history of blood loss: No Liver disease: Yes Bariatric surgery: No Prior evaluation with hematology: No Prior bone marrow biopsy: No Oral iron: Not currently Prior IV iron infusions: No Family history Anemia: No- no known sickle cell or thalassemia  Family History colon cancer: No ETOH- 6 pack of standard cans of beer with 6 shots of vodka.  No Personal or family history of bleeding or clotting disorders.  Not on any special diets- 1 meal per day. Meat and two veggies.   MEDICAL HISTORY:  Past Medical History:  Diagnosis Date   Acute pancreatitis     SURGICAL HISTORY: Past Surgical History:  Procedure Laterality Date   ROTATOR CUFF REPAIR      SOCIAL HISTORY: Social History   Socioeconomic History   Marital status: Married    Spouse name: Not on file   Number of children: Not on file   Years of education: Not on file   Highest education level: Not on file  Occupational History   Not on file  Tobacco Use   Smoking status: Every Day    Packs/day: 1    Types: Cigarettes   Smokeless tobacco: Never  Vaping Use   Vaping Use: Never  used  Substance and Sexual Activity   Alcohol use: Yes    Alcohol/week: 3.0 standard drinks of alcohol    Types: 3 Cans of beer per week   Drug use: No   Sexual activity: Not on file  Other Topics Concern   Not on file  Social History Narrative   Not on file   Social Determinants of Health   Financial Resource Strain: Not on file  Food Insecurity: No Food Insecurity (08/06/2022)   Hunger Vital Sign    Worried About Running Out of Food in the Last Year: Never true    Ran Out of Food in the Last Year: Never true  Transportation Needs: No Transportation Needs (08/06/2022)   PRAPARE - Administrator, Civil Service (Medical): No    Lack of Transportation (Non-Medical): No  Physical Activity: Not on file  Stress: Not on file  Social Connections: Not on file  Intimate Partner Violence: Not At Risk (08/06/2022)   Humiliation, Afraid, Rape, and Kick questionnaire    Fear of Current or Ex-Partner: No    Emotionally Abused: No    Physically Abused: No    Sexually Abused: No    FAMILY HISTORY: No family history on file.  ALLERGIES:  has No Known Allergies.  MEDICATIONS:  Current Outpatient Medications  Medication Sig Dispense Refill   carvedilol (COREG) 3.125 MG tablet Take 1 tablet (3.125 mg total) by mouth 2 (two)  times daily with a meal. 60 tablet 0   lidocaine (LIDODERM) 5 % Place 2 patches onto the skin daily. Remove & Discard patch within 12 hours or as directed by MD 30 patch 0   nicotine (NICODERM CQ - DOSED IN MG/24 HOURS) 21 mg/24hr patch Place 1 patch (21 mg total) onto the skin daily. 28 patch 0   No current facility-administered medications for this visit.    REVIEW OF SYSTEMS:   Constitutional: ( - ) fevers, ( - )  chills , ( + ) night sweats Eyes: ( + ) blurriness of vision, ( - ) double vision, ( - ) watery eyes Ears, nose, mouth, throat, and face: ( - ) mucositis, ( - ) sore throat Respiratory: ( - ) cough, ( - ) dyspnea, ( - )  wheezes Cardiovascular: ( - ) palpitation, ( - ) chest discomfort, ( - ) lower extremity swelling Gastrointestinal:  ( - ) nausea, ( - ) heartburn, ( - ) change in bowel habits Skin: ( - ) abnormal skin rashes Lymphatics: ( - ) new lymphadenopathy, ( - ) easy bruising Neurological: ( - ) numbness, ( - ) tingling, ( - ) new weaknesses Behavioral/Psych: ( - ) mood change, ( - ) new changes  All other systems were reviewed with the patient and are negative.  PHYSICAL EXAMINATION: ECOG PERFORMANCE STATUS: 1 - Symptomatic but completely ambulatory  Vitals:   09/10/22 1400  BP: 108/60  Pulse: 97  Resp: 18  Temp: 97.6 F (36.4 C)  SpO2: 98%   Filed Weights   09/10/22 1400  Weight: 178 lb (80.7 kg)    GENERAL: well appearing male in NAD  SKIN: skin color, texture, turgor are normal, no rashes or significant lesions EYES: conjunctiva are pink and non-injected, sclera clear OROPHARYNX: no exudate, no erythema; lips, buccal mucosa, and tongue normal  NECK: supple, non-tender LYMPH:  no palpable lymphadenopathy in the cervical, axillary or supraclavicular lymph nodes.  LUNGS: clear to auscultation and percussion with normal breathing effort HEART: regular rate & rhythm and no murmurs and no lower extremity edema ABDOMEN: soft, non-tender, non-distended, normal bowel sounds Musculoskeletal: no cyanosis of digits and no clubbing  PSYCH: alert & oriented x 3, fluent speech NEURO: no focal motor/sensory deficits  LABORATORY DATA:  I have reviewed the data as listed    Latest Ref Rng & Units 08/07/2022    4:57 AM 08/06/2022    2:15 PM 06/17/2022    1:25 AM  CBC  WBC 4.0 - 10.5 K/uL 5.7  6.5  4.5   Hemoglobin 13.0 - 17.0 g/dL 81.1  91.4  78.2   Hematocrit 39.0 - 52.0 % 34.0  34.6  33.4   Platelets 150 - 400 K/uL 155  171  151        Latest Ref Rng & Units 08/08/2022    5:00 AM 08/07/2022    4:57 AM 08/06/2022    1:38 PM  CMP  Glucose 70 - 99 mg/dL 956  98  90   BUN 6 - 20 mg/dL 7   6  5    Creatinine 0.61 - 1.24 mg/dL 2.13  0.86  5.78   Sodium 135 - 145 mmol/L 131  134  133   Potassium 3.5 - 5.1 mmol/L 3.3  3.1  3.7   Chloride 98 - 111 mmol/L 98  97  96   CO2 22 - 32 mmol/L 22  27  21    Calcium 8.9 - 10.3 mg/dL 8.3  8.4  8.4   Total Protein 6.5 - 8.1 g/dL  7.9    Total Bilirubin 0.3 - 1.2 mg/dL  1.4    Alkaline Phos 38 - 126 U/L  81    AST 15 - 41 U/L  26    ALT 0 - 44 U/L  27      ASSESSMENT & PLAN Encounter Diagnosis  Name Primary?   Anemia, unspecified type Yes   Jamie Campos is a 52 y.o. male with newly diagnosed mild anemia. He has a history of many nutritional deficiencies secondary to his ETOH abuse and he has had mild rectal bleeding episodes. He has been referred for colonoscopy for evaluation which I agree with. Given his history I would also suggest an endoscopy and potentially a capsule study as well. Recent urinalysis did not show hematuria. For now he is not taking an iron supplement. I would first suggest a multivitamin daily that contains iron and folate. I would suggest he work with his PCP on ETOH reduction. I would suggest we follow up in 1 months time to see if oral supplementation has helped. If not he may be a candidate for IV iron therapy. In the meantime an abdominal US for his history of ETOH use, palpable spleen recommended.   Disposition: Today's labs are pending.   RTC 1 month, APP, labs ( CBC w/, CMP, iron, ferritin, retic)-[place]  Abdominal US  Orders Placed This Encounter  Procedures   CBC with Differential (Cancer Center Only)    Standing Status:   Future    Standing Expiration Date:   09/10/2023   CMP (Cancer Center only)    Standing Status:   Future    Standing Expiration Date:   09/10/2023   Iron and TIBC    Standing Status:   Future    Standing Expiration Date:   09/10/2023   Ferritin    Standing Status:   Future    Standing Expiration Date:   09/10/2023   Folate, Serum    Standing Status:   Future    Standing Expiration  Date:   09/10/2023   Vitamin B12    Standing Status:   Future    Standing Expiration Date:   09/10/2023   Erythropoietin    Standing Status:   Future    Standing Expiration Date:   09/10/2023    All questions were answered. The patient knows to call the clinic with any problems, questions or concerns.  I have spent a total of 40 minutes minutes of face-to-face and non-face-to-face time, preparing to see the patient, obtaining and/or reviewing separately obtained history, performing a medically appropriate examination, counseling and educating the patient, ordering medications/tests/procedures, referring and communicating with other health care professionals, documenting clinical information in the electronic health record, independently interpreting results and communicating results to the patient, and care coordination.    Clent Jacks PA-C Department of Hematology/Oncology [facility] at [facility]

## 2022-09-11 LAB — ERYTHROPOIETIN: Erythropoietin: 9.7 m[IU]/mL (ref 2.6–18.5)

## 2022-09-11 LAB — IRON AND IRON BINDING CAPACITY (CC-WL,HP ONLY)
Iron: 102 ug/dL (ref 45–182)
Saturation Ratios: 30 % (ref 17.9–39.5)
TIBC: 342 ug/dL (ref 250–450)
UIBC: 240 ug/dL (ref 117–376)

## 2022-09-12 ENCOUNTER — Ambulatory Visit (HOSPITAL_BASED_OUTPATIENT_CLINIC_OR_DEPARTMENT_OTHER)
Admission: RE | Admit: 2022-09-12 | Discharge: 2022-09-12 | Disposition: A | Discharge status: Home / Self Care | Payer: Managed Care, Other (non HMO) | Source: Ambulatory Visit | Attending: Medical Oncology | Admitting: Medical Oncology

## 2022-09-12 DIAGNOSIS — R161 Splenomegaly, not elsewhere classified: Secondary | ICD-10-CM | POA: Diagnosis present

## 2022-09-12 DIAGNOSIS — D649 Anemia, unspecified: Secondary | ICD-10-CM | POA: Diagnosis not present

## 2022-09-12 DIAGNOSIS — Z789 Other specified health status: Secondary | ICD-10-CM | POA: Insufficient documentation

## 2022-09-13 ENCOUNTER — Telehealth: Payer: Self-pay | Admitting: Medical Oncology

## 2022-09-13 NOTE — Telephone Encounter (Signed)
Patient has been scheduled for an appt per 09/10/22 LOS. Aware of appt date and time

## 2022-09-16 ENCOUNTER — Other Ambulatory Visit: Payer: Managed Care, Other (non HMO)

## 2022-09-16 ENCOUNTER — Encounter: Payer: Managed Care, Other (non HMO) | Admitting: Family

## 2022-10-07 ENCOUNTER — Other Ambulatory Visit: Payer: Self-pay | Admitting: Medical Oncology

## 2022-10-07 DIAGNOSIS — D649 Anemia, unspecified: Secondary | ICD-10-CM

## 2022-10-07 DIAGNOSIS — Z789 Other specified health status: Secondary | ICD-10-CM

## 2022-10-08 ENCOUNTER — Inpatient Hospital Stay: Payer: Managed Care, Other (non HMO)

## 2022-10-08 ENCOUNTER — Inpatient Hospital Stay: Payer: Managed Care, Other (non HMO) | Attending: Family | Admitting: Medical Oncology

## 2022-10-08 ENCOUNTER — Other Ambulatory Visit (HOSPITAL_BASED_OUTPATIENT_CLINIC_OR_DEPARTMENT_OTHER): Payer: Self-pay

## 2022-10-08 MED ORDER — NICOTINE 21 MG/24HR TD PT24
21.0000 mg | MEDICATED_PATCH | Freq: Every day | TRANSDERMAL | 0 refills | Status: DC
  Filled 2022-10-08: qty 28, 28d supply, fill #0

## 2022-10-08 MED ORDER — BISACODYL 5 MG PO TBEC
5.0000 mg | DELAYED_RELEASE_TABLET | ORAL | 0 refills | Status: DC
  Filled 2022-10-08: qty 25, 5d supply, fill #0

## 2022-10-08 MED ORDER — PEG 3350-KCL-NA BICARB-NACL 420 G PO SOLR
4000.0000 mL | ORAL | 0 refills | Status: DC
  Filled 2022-10-08: qty 4000, 1d supply, fill #0

## 2023-11-28 ENCOUNTER — Other Ambulatory Visit (HOSPITAL_BASED_OUTPATIENT_CLINIC_OR_DEPARTMENT_OTHER): Payer: Self-pay

## 2023-11-28 MED ORDER — OMEPRAZOLE 40 MG PO CPDR
40.0000 mg | DELAYED_RELEASE_CAPSULE | Freq: Every day | ORAL | 0 refills | Status: DC
  Filled 2023-11-28: qty 30, 30d supply, fill #0
  Filled 2024-01-13: qty 30, 30d supply, fill #1

## 2023-11-28 MED ORDER — PEG-3350/ELECTROLYTES 236 G PO SOLR
4000.0000 mL | ORAL | 0 refills | Status: DC
  Filled 2023-11-28: qty 4000, 1d supply, fill #0

## 2023-12-20 ENCOUNTER — Other Ambulatory Visit: Payer: Self-pay

## 2023-12-20 ENCOUNTER — Emergency Department (HOSPITAL_BASED_OUTPATIENT_CLINIC_OR_DEPARTMENT_OTHER)

## 2023-12-20 ENCOUNTER — Inpatient Hospital Stay (HOSPITAL_BASED_OUTPATIENT_CLINIC_OR_DEPARTMENT_OTHER)
Admission: EM | Admit: 2023-12-20 | Discharge: 2023-12-24 | DRG: 438 | Disposition: A | Discharge status: Home / Self Care | Attending: Internal Medicine | Admitting: Internal Medicine

## 2023-12-20 ENCOUNTER — Encounter (HOSPITAL_BASED_OUTPATIENT_CLINIC_OR_DEPARTMENT_OTHER): Payer: Self-pay | Admitting: Emergency Medicine

## 2023-12-20 DIAGNOSIS — I5021 Acute systolic (congestive) heart failure: Secondary | ICD-10-CM | POA: Diagnosis present

## 2023-12-20 DIAGNOSIS — E876 Hypokalemia: Secondary | ICD-10-CM | POA: Diagnosis present

## 2023-12-20 DIAGNOSIS — F1290 Cannabis use, unspecified, uncomplicated: Secondary | ICD-10-CM | POA: Diagnosis present

## 2023-12-20 DIAGNOSIS — F1090 Alcohol use, unspecified, uncomplicated: Secondary | ICD-10-CM | POA: Diagnosis present

## 2023-12-20 DIAGNOSIS — F1020 Alcohol dependence, uncomplicated: Secondary | ICD-10-CM | POA: Diagnosis present

## 2023-12-20 DIAGNOSIS — K852 Alcohol induced acute pancreatitis without necrosis or infection: Secondary | ICD-10-CM | POA: Diagnosis not present

## 2023-12-20 DIAGNOSIS — I5020 Unspecified systolic (congestive) heart failure: Secondary | ICD-10-CM

## 2023-12-20 DIAGNOSIS — F17210 Nicotine dependence, cigarettes, uncomplicated: Secondary | ICD-10-CM | POA: Diagnosis present

## 2023-12-20 DIAGNOSIS — K859 Acute pancreatitis without necrosis or infection, unspecified: Secondary | ICD-10-CM | POA: Diagnosis present

## 2023-12-20 DIAGNOSIS — R7989 Other specified abnormal findings of blood chemistry: Secondary | ICD-10-CM | POA: Diagnosis present

## 2023-12-20 DIAGNOSIS — Z7409 Other reduced mobility: Secondary | ICD-10-CM | POA: Diagnosis present

## 2023-12-20 DIAGNOSIS — E8729 Other acidosis: Secondary | ICD-10-CM | POA: Diagnosis present

## 2023-12-20 DIAGNOSIS — I429 Cardiomyopathy, unspecified: Secondary | ICD-10-CM | POA: Diagnosis present

## 2023-12-20 DIAGNOSIS — Z79899 Other long term (current) drug therapy: Secondary | ICD-10-CM

## 2023-12-20 DIAGNOSIS — K760 Fatty (change of) liver, not elsewhere classified: Secondary | ICD-10-CM | POA: Diagnosis present

## 2023-12-20 DIAGNOSIS — K219 Gastro-esophageal reflux disease without esophagitis: Secondary | ICD-10-CM | POA: Diagnosis present

## 2023-12-20 DIAGNOSIS — E871 Hypo-osmolality and hyponatremia: Secondary | ICD-10-CM | POA: Diagnosis present

## 2023-12-20 DIAGNOSIS — R0602 Shortness of breath: Secondary | ICD-10-CM | POA: Diagnosis not present

## 2023-12-20 DIAGNOSIS — I4711 Inappropriate sinus tachycardia, so stated: Secondary | ICD-10-CM | POA: Diagnosis present

## 2023-12-20 DIAGNOSIS — D539 Nutritional anemia, unspecified: Secondary | ICD-10-CM | POA: Diagnosis present

## 2023-12-20 HISTORY — DX: Anemia, unspecified: D64.9

## 2023-12-20 HISTORY — DX: Pneumonia, unspecified organism: J18.9

## 2023-12-20 HISTORY — DX: Alcohol use, unspecified, uncomplicated: F10.90

## 2023-12-20 HISTORY — DX: Multiple fractures of ribs, unspecified side, initial encounter for closed fracture: S22.49XA

## 2023-12-20 LAB — URINE DRUG SCREEN
Amphetamines: NEGATIVE
Barbiturates: NEGATIVE
Benzodiazepines: NEGATIVE
Cocaine: NEGATIVE
Fentanyl: NEGATIVE
Methadone Scn, Ur: NEGATIVE
Opiates: NEGATIVE
Tetrahydrocannabinol: POSITIVE — AB

## 2023-12-20 LAB — CBC
HCT: 34.3 % — ABNORMAL LOW (ref 39.0–52.0)
Hemoglobin: 12.2 g/dL — ABNORMAL LOW (ref 13.0–17.0)
MCH: 35.7 pg — ABNORMAL HIGH (ref 26.0–34.0)
MCHC: 35.6 g/dL (ref 30.0–36.0)
MCV: 100.3 fL — ABNORMAL HIGH (ref 80.0–100.0)
Platelets: 101 K/uL — ABNORMAL LOW (ref 150–400)
RBC: 3.42 MIL/uL — ABNORMAL LOW (ref 4.22–5.81)
RDW: 13.7 % (ref 11.5–15.5)
WBC: 6 K/uL (ref 4.0–10.5)
nRBC: 0 % (ref 0.0–0.2)

## 2023-12-20 LAB — URINALYSIS, ROUTINE W REFLEX MICROSCOPIC
Glucose, UA: NEGATIVE mg/dL
Ketones, ur: >=80 mg/dL — AB
Leukocytes,Ua: NEGATIVE
Nitrite: NEGATIVE
Protein, ur: >=300 mg/dL — AB
Specific Gravity, Urine: 1.02 (ref 1.005–1.030)
pH: 5.5 (ref 5.0–8.0)

## 2023-12-20 LAB — COMPREHENSIVE METABOLIC PANEL WITH GFR
ALT: 56 U/L — ABNORMAL HIGH (ref 0–44)
AST: 105 U/L — ABNORMAL HIGH (ref 15–41)
Albumin: 4.9 g/dL (ref 3.5–5.0)
Alkaline Phosphatase: 163 U/L — ABNORMAL HIGH (ref 38–126)
Anion gap: 24 — ABNORMAL HIGH (ref 5–15)
BUN: 8 mg/dL (ref 6–20)
CO2: 16 mmol/L — ABNORMAL LOW (ref 22–32)
Calcium: 10.2 mg/dL (ref 8.9–10.3)
Chloride: 91 mmol/L — ABNORMAL LOW (ref 98–111)
Creatinine, Ser: 1.19 mg/dL (ref 0.61–1.24)
GFR, Estimated: >60 mL/min (ref >60)
Glucose, Bld: 126 mg/dL — ABNORMAL HIGH (ref 70–99)
Potassium: 3.8 mmol/L (ref 3.5–5.1)
Sodium: 130 mmol/L — ABNORMAL LOW (ref 135–145)
Total Bilirubin: 1.2 mg/dL (ref 0.0–1.2)
Total Protein: 9.7 g/dL — ABNORMAL HIGH (ref 6.5–8.1)

## 2023-12-20 LAB — I-STAT VENOUS BLOOD GAS, ED
Acid-base deficit: 9 mmol/L — ABNORMAL HIGH (ref 0.0–2.0)
Bicarbonate: 14.9 mmol/L — ABNORMAL LOW (ref 20.0–28.0)
Calcium, Ion: 1.15 mmol/L (ref 1.15–1.40)
HCT: 37 % — ABNORMAL LOW (ref 39.0–52.0)
Hemoglobin: 12.6 g/dL — ABNORMAL LOW (ref 13.0–17.0)
O2 Saturation: 90 %
Patient temperature: 98.4
Potassium: 3.8 mmol/L (ref 3.5–5.1)
Sodium: 128 mmol/L — ABNORMAL LOW (ref 135–145)
TCO2: 16 mmol/L — ABNORMAL LOW (ref 22–32)
pCO2, Ven: 27.5 mmHg — ABNORMAL LOW (ref 44–60)
pH, Ven: 7.341 (ref 7.25–7.43)
pO2, Ven: 60 mmHg — ABNORMAL HIGH (ref 32–45)

## 2023-12-20 LAB — DIFFERENTIAL
Abs Immature Granulocytes: 0.03 K/uL (ref 0.00–0.07)
Basophils Absolute: 0 K/uL (ref 0.0–0.1)
Basophils Relative: 0 %
Eosinophils Absolute: 0 K/uL (ref 0.0–0.5)
Eosinophils Relative: 0 %
Immature Granulocytes: 1 %
Lymphocytes Relative: 7 %
Lymphs Abs: 0.4 K/uL — ABNORMAL LOW (ref 0.7–4.0)
Monocytes Absolute: 0.6 K/uL (ref 0.1–1.0)
Monocytes Relative: 10 %
Neutro Abs: 5 K/uL (ref 1.7–7.7)
Neutrophils Relative %: 82 %

## 2023-12-20 LAB — URINALYSIS, MICROSCOPIC (REFLEX)

## 2023-12-20 LAB — PROTIME-INR
INR: 1 (ref 0.8–1.2)
Prothrombin Time: 13.3 s (ref 11.4–15.2)

## 2023-12-20 LAB — ETHANOL: Alcohol, Ethyl (B): <15 mg/dL (ref <15)

## 2023-12-20 LAB — LACTIC ACID, PLASMA: Lactic Acid, Venous: 2.4 mmol/L (ref 0.5–1.9)

## 2023-12-20 LAB — SALICYLATE LEVEL: Salicylate Lvl: <7 mg/dL — ABNORMAL LOW (ref 7.0–30.0)

## 2023-12-20 LAB — LIPASE, BLOOD: Lipase: 1484 U/L — ABNORMAL HIGH (ref 11–51)

## 2023-12-20 LAB — TROPONIN T, HIGH SENSITIVITY: Troponin T High Sensitivity: <15 ng/L (ref 0–19)

## 2023-12-20 MED ORDER — IOHEXOL 350 MG/ML SOLN
100.0000 mL | Freq: Once | INTRAVENOUS | Status: AC | PRN
  Administered 2023-12-20: 100 mL via INTRAVENOUS

## 2023-12-20 MED ORDER — MORPHINE SULFATE (PF) 4 MG/ML IV SOLN
4.0000 mg | Freq: Once | INTRAVENOUS | Status: AC
  Administered 2023-12-20: 4 mg via INTRAVENOUS
  Filled 2023-12-20: qty 1

## 2023-12-20 MED ORDER — LORAZEPAM 2 MG/ML IJ SOLN
1.0000 mg | INTRAMUSCULAR | Status: DC | PRN
  Administered 2023-12-20: 1 mg via INTRAVENOUS
  Administered 2023-12-21: 2 mg via INTRAVENOUS
  Administered 2023-12-21: 1 mg via INTRAVENOUS
  Filled 2023-12-20 (×2): qty 1
  Filled 2023-12-20: qty 2

## 2023-12-20 MED ORDER — LACTATED RINGERS IV BOLUS
500.0000 mL | Freq: Once | INTRAVENOUS | Status: AC
  Administered 2023-12-20: 500 mL via INTRAVENOUS

## 2023-12-20 MED ORDER — ADULT MULTIVITAMIN W/MINERALS CH
1.0000 | ORAL_TABLET | Freq: Every day | ORAL | Status: DC
  Administered 2023-12-20 – 2023-12-24 (×5): 1 via ORAL
  Filled 2023-12-20 (×5): qty 1

## 2023-12-20 MED ORDER — LORAZEPAM 1 MG PO TABS
1.0000 mg | ORAL_TABLET | ORAL | Status: DC | PRN
  Administered 2023-12-20: 2 mg via ORAL
  Filled 2023-12-20: qty 2

## 2023-12-20 MED ORDER — FOLIC ACID 1 MG PO TABS
1.0000 mg | ORAL_TABLET | Freq: Every day | ORAL | Status: DC
  Administered 2023-12-20 – 2023-12-24 (×5): 1 mg via ORAL
  Filled 2023-12-20 (×5): qty 1

## 2023-12-20 MED ORDER — LACTATED RINGERS IV BOLUS
1000.0000 mL | Freq: Once | INTRAVENOUS | Status: AC
  Administered 2023-12-20: 1000 mL via INTRAVENOUS

## 2023-12-20 MED ORDER — LORAZEPAM 2 MG/ML IJ SOLN
1.0000 mg | Freq: Once | INTRAMUSCULAR | Status: AC
  Administered 2023-12-20: 1 mg via INTRAVENOUS
  Filled 2023-12-20: qty 1

## 2023-12-20 MED ORDER — THIAMINE HCL 100 MG/ML IJ SOLN
100.0000 mg | Freq: Every day | INTRAMUSCULAR | Status: DC
  Administered 2023-12-20: 100 mg via INTRAVENOUS
  Filled 2023-12-20 (×2): qty 2

## 2023-12-20 MED ORDER — THIAMINE MONONITRATE 100 MG PO TABS
100.0000 mg | ORAL_TABLET | Freq: Every day | ORAL | Status: DC
  Administered 2023-12-21 – 2023-12-24 (×4): 100 mg via ORAL
  Filled 2023-12-20 (×4): qty 1

## 2023-12-20 MED ORDER — ONDANSETRON HCL 4 MG/2ML IJ SOLN
4.0000 mg | Freq: Once | INTRAMUSCULAR | Status: AC
  Administered 2023-12-20: 4 mg via INTRAVENOUS
  Filled 2023-12-20: qty 2

## 2023-12-20 NOTE — H&P (Addendum)
 Location: [facility]  Provider: Bettyann Sables  CC: cp/n/v/abdominal pain in setting of excessive etoh use,last drink last pm  DX: EtOH pancreatitis, starvation ketosis  PMH:  ETOH  Hx of pancreatitis   HPI:  Cp/sob/abd pain / last drink last pm  Came in due to increase symptoms \   Exam per EDP:  Tender in epigastric region  Did have tongue fasculitations but no tremors no diarrhea  And only one episode of vomiting in Ed   Labs:  AG 24, elevated Lfts, lipase 1400  Lactic :2.4  UA : + bacteria ,urine culture ordered per EDP  Etoh :neg  Tx :  with 2L of NS , thiamine  , 1mg  ativan   exam  CTAb: acute pancreatitis  BP   119/99 hr 105 rr 22 sat 99% on ra  Temp 97.8  Status: Admit to [facility] pending, EDP will continue to care for patient until patient arrives at [facility]

## 2023-12-20 NOTE — ED Provider Notes (Signed)
 [place] [facility] AT [facility] Provider Note   CSN: [id number] Arrival date & time: 12/20/23  1705     Patient presents with: Shortness of Breath   DIONTAY ROSENCRANS is a 53 y.o. male.   Shortness of Breath Associated symptoms: abdominal pain and vomiting   Patient is a 53 year old male presented ED today for concerns for right-sided chest pain radiating to back accompanied with shortness of breath that is worse on exertion noting to have had symptoms started yesterday.  Medical history of alcohol abuse, tobacco abuse, pancreatitis, anemia.  Reports drinking 6 beers a day, with last drink last night.  Reporting 1 episode of nausea and vomiting this morning accompanied with generalized abdominal pain with localization to the epigastric region.  Endorsing body aches and chills with having taken ibuprofen  approximately 4 hours ago.  States he does have chronic cough at baseline.  This is not worsened.   Denies THC, illicit drug use, chronic NSAID use.  Denies headache, vision changes, hemoptysis, hematemesis, diarrhea, hematochezia, melena, dysuria, lower leg swelling.  Prior to Admission medications   Medication Sig Start Date End Date Taking? Authorizing Provider  bisacodyl  (DULCOLAX) 5 MG EC tablet Take 1 tablet (5 mg total) by mouth as directed. 10/01/22     carvedilol  (COREG ) 3.125 MG tablet Take 1 tablet (3.125 mg total) by mouth 2 (two) times daily with a meal. 08/08/22   Vann, Jessica U, DO  lidocaine  (LIDODERM ) 5 % Place 2 patches onto the skin daily. Remove & Discard patch within 12 hours or as directed by MD 08/08/22   Juvenal Harlene PENNER, DO  nicotine  (NICODERM CQ ) 21 mg/24hr patch Instill 1 patch onto clean, dry, intact skin once daily as directed 10/08/22     omeprazole  (PRILOSEC) 40 MG capsule Take 1 capsule (40mg ) by mouth one-half to 1 hour before morning meal once a day. 11/28/23     PEG 3350 -KCl-NaBcb-NaCl-NaSulf (PEG-3350/ELECTROLYTES) 236 g SOLR Use  as directed. 11/28/23   Brahmbhatt, Parag, MD  polyethylene glycol-electrolytes (NULYTELY ) 420 g solution Take 4,000 mLs by mouth as directed. 10/01/22       Allergies: Patient has no known allergies.    Review of Systems  Respiratory:  Positive for shortness of breath.   Gastrointestinal:  Positive for abdominal pain, nausea and vomiting.  Musculoskeletal:  Positive for back pain.  All other systems reviewed and are negative.   Updated Vital Signs BP (!) 119/99   Pulse (!) 105   Temp 97.8 F (36.6 C) (Oral)   Resp 18   Ht 5' 9 (1.753 m)   Wt 81.6 kg   SpO2 99%   BMI 26.58 kg/m   Physical Exam Vitals and nursing note reviewed.  Constitutional:      General: He is not in acute distress.    Appearance: Normal appearance. He is not ill-appearing or diaphoretic.  HENT:     Head: Normocephalic and atraumatic.  Eyes:     General: No scleral icterus.       Right eye: No discharge.        Left eye: No discharge.     Extraocular Movements: Extraocular movements intact.     Conjunctiva/sclera: Conjunctivae normal.  Cardiovascular:     Rate and Rhythm: Normal rate and regular rhythm.     Pulses: Normal pulses.     Heart sounds: Normal heart sounds. No murmur heard.    No friction rub. No gallop.  Pulmonary:     Effort: Pulmonary effort  is normal. Tachypnea present. No respiratory distress.     Breath sounds: No stridor. No decreased breath sounds, wheezing, rhonchi or rales.  Chest:     Chest wall: No tenderness.  Abdominal:     General: Abdomen is flat. There is no distension.     Palpations: Abdomen is soft.     Tenderness: There is generalized abdominal tenderness and tenderness in the epigastric area. There is no right CVA tenderness, left CVA tenderness, guarding or rebound. Negative signs include McBurney's sign.  Musculoskeletal:        General: No swelling, deformity or signs of injury.     Cervical back: Normal range of motion. No rigidity.     Right lower leg: No  tenderness. No edema.     Left lower leg: No tenderness. No edema.  Skin:    General: Skin is warm and dry.     Findings: No bruising, erythema or lesion.  Neurological:     General: No focal deficit present.     Mental Status: He is alert and oriented to person, place, and time. Mental status is at baseline.     Sensory: No sensory deficit.     Motor: No weakness.  Psychiatric:        Mood and Affect: Mood normal.     (all labs ordered are listed, but only abnormal results are displayed) Labs Reviewed  COMPREHENSIVE METABOLIC PANEL WITH GFR - Abnormal; Notable for the following components:      Result Value   Sodium 130 (*)    Chloride 91 (*)    CO2 16 (*)    Glucose, Bld 126 (*)    Total Protein 9.7 (*)    AST 105 (*)    ALT 56 (*)    Alkaline Phosphatase 163 (*)    Anion gap 24 (*)    All other components within normal limits  LIPASE, BLOOD - Abnormal; Notable for the following components:   Lipase 1,484 (*)    All other components within normal limits  URINALYSIS, ROUTINE W REFLEX MICROSCOPIC - Abnormal; Notable for the following components:   Hgb urine dipstick SMALL (*)    Bilirubin Urine SMALL (*)    Ketones, ur >=80 (*)    Protein, ur >=300 (*)    All other components within normal limits  URINE DRUG SCREEN - Abnormal; Notable for the following components:   Tetrahydrocannabinol POSITIVE (*)    All other components within normal limits  CBC - Abnormal; Notable for the following components:   RBC 3.42 (*)    Hemoglobin 12.2 (*)    HCT 34.3 (*)    MCV 100.3 (*)    MCH 35.7 (*)    Platelets 101 (*)    All other components within normal limits  DIFFERENTIAL - Abnormal; Notable for the following components:   Lymphs Abs 0.4 (*)    All other components within normal limits  SALICYLATE LEVEL - Abnormal; Notable for the following components:   Salicylate Lvl <7.0 (*)    All other components within normal limits  LACTIC ACID, PLASMA - Abnormal; Notable for the  following components:   Lactic Acid, Venous 2.4 (*)    All other components within normal limits  URINALYSIS, MICROSCOPIC (REFLEX) - Abnormal; Notable for the following components:   Bacteria, UA MANY (*)    All other components within normal limits  I-STAT VENOUS BLOOD GAS, ED - Abnormal; Notable for the following components:   pCO2, Ven 27.5 (*)  pO2, Ven 60 (*)    Bicarbonate 14.9 (*)    TCO2 16 (*)    Acid-base deficit 9.0 (*)    Sodium 128 (*)    HCT 37.0 (*)    Hemoglobin 12.6 (*)    All other components within normal limits  URINE CULTURE  ETHANOL  PROTIME-INR  CBC WITH DIFFERENTIAL/PLATELET  TROPONIN T, HIGH SENSITIVITY    EKG: EKG Interpretation Date/Time:  Saturday December 20 2023 17:27:58 EDT Ventricular Rate:  108 PR Interval:  149 QRS Duration:  88 QT Interval:  317 QTC Calculation: 425 R Axis:   38  Text Interpretation: Sinus tachycardia Confirmed by Ruthe Cornet [phone]) on 12/20/2023 5:31:58 PM  Radiology: CT Angio Chest/Abd/Pel for Dissection W and/or Wo Contrast Result Date: 12/20/2023 CLINICAL DATA:  Short of breath, decreased appetite, right-sided chest pain, generalized abdominal pain EXAM: CT ANGIOGRAPHY CHEST, ABDOMEN AND PELVIS TECHNIQUE: Initial noncontrast CT of the chest was obtained. Multidetector CT imaging through the chest, abdomen and pelvis was performed using the standard protocol during bolus administration of intravenous contrast. Multiplanar reconstructed images and MIPs were obtained and reviewed to evaluate the vascular anatomy. RADIATION DOSE REDUCTION: This exam was performed according to the departmental dose-optimization program which includes automated exposure control, adjustment of the mA and/or kV according to patient size and/or use of iterative reconstruction technique. CONTRAST:  OMNIPAQUE  IOHEXOL  350 MG/ML SOLN COMPARISON:  08/06/2022 FINDINGS: CTA CHEST FINDINGS Cardiovascular: No evidence of thoracic aortic aneurysm  or dissection. Atherosclerosis of the aortic arch and descending thoracic aorta. There is technically adequate opacification of the pulmonary vasculature. No filling defects or pulmonary emboli. The heart is unremarkable without pericardial effusion. Mediastinum/Nodes: No enlarged mediastinal, hilar, or axillary lymph nodes. Thyroid gland, trachea, and esophagus demonstrate no significant findings. Lungs/Pleura: No acute airspace disease, effusion, or pneumothorax. Central airways are patent. 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 No acute displaced fractures. Reconstructed images demonstrate no additional findings. Review of the MIP images confirms the above findings. CTA ABDOMEN AND PELVIS FINDINGS VASCULAR Aorta: Normal caliber aorta without aneurysm, dissection, vasculitis or significant stenosis. Diffuse atherosclerosis. Celiac: Patent without evidence of aneurysm, dissection, vasculitis or significant stenosis. SMA: Patent without evidence of aneurysm, dissection, vasculitis or significant stenosis. Renals: Both renal arteries are patent without evidence of aneurysm, dissection, vasculitis, fibromuscular dysplasia or significant stenosis. IMA: Patent without evidence of aneurysm, dissection, vasculitis or significant stenosis. Inflow: Stable irregular atheromatous plaque within the right common iliac artery, with estimated 50% stenosis unchanged. No other significant stenosis, aneurysm, dissection, or vasculitis. Veins: No obvious venous abnormality within the limitations of this arterial phase study. Review of the MIP images confirms the above findings. NON-VASCULAR Hepatobiliary: Diffuse hepatic steatosis. No focal liver abnormality. The gallbladder is unremarkable. Pancreas: Prominent edema within the pancreatic head, with significant peripancreatic fat stranding, consistent with acute uncomplicated pancreatitis. No fluid collection, pseudocyst, or  abscess. No pancreatic duct dilation. Spleen: Normal in size without focal abnormality. Adrenals/Urinary Tract: Adrenal glands are unremarkable. Kidneys are normal, without renal calculi, focal lesion, or hydronephrosis. Bladder is unremarkable. Stomach/Bowel: No bowel obstruction or ileus. Normal appendix right lower quadrant. Scattered sigmoid diverticulosis without diverticulitis. No bowel wall thickening. Lymphatic: No pathologic adenopathy. Reproductive: Prostate is unremarkable. Other: No free fluid or free intraperitoneal gas. No abdominal wall hernia. Musculoskeletal: No acute or destructive bony abnormalities. Reconstructed images demonstrate no additional findings. Review of the MIP images confirms the above findings. IMPRESSION: Vascular: 1. No evidence of thoracoabdominal aortic aneurysm  or dissection. 2. No evidence of pulmonary embolus. 3.  Aortic Atherosclerosis (ICD10-I70.0). 4. 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 Nonvascular: 1. Acute uncomplicated pancreatitis. No fluid collection, pseudocyst, or abscess. 2. Hepatic steatosis. 3. Sigmoid diverticulosis without diverticulitis. Electronically Signed   By: Ozell Daring M.D.   On: 12/20/2023 19:28    Procedures   Medications Ordered in the ED  LORazepam  (ATIVAN ) tablet 1-4 mg ( Oral See Alternative 12/20/23 1928)    Or  LORazepam  (ATIVAN ) injection 1-4 mg (1 mg Intravenous Given 12/20/23 1928)  thiamine  (VITAMIN B1) tablet 100 mg ( Oral See Alternative 12/20/23 1929)    Or  thiamine  (VITAMIN B1) injection 100 mg (100 mg Intravenous Given 12/20/23 1929)  folic acid  (FOLVITE ) tablet 1 mg (1 mg Oral Given 12/20/23 1934)  multivitamin with minerals tablet 1 tablet (1 tablet Oral Given 12/20/23 1934)  LORazepam  (ATIVAN ) injection 1 mg (1 mg Intravenous Given 12/20/23 1800)  lactated ringers bolus 500 mL (0 mLs Intravenous Stopped 12/20/23 1930)  iohexol  (OMNIPAQUE ) 350 MG/ML injection 100 mL (100 mLs  Intravenous Contrast Given 12/20/23 1902)  lactated ringers bolus 1,000 mL (1,000 mLs Intravenous New Bag/Given 12/20/23 1934)  ondansetron  (ZOFRAN ) injection 4 mg (4 mg Intravenous Given 12/20/23 1928)  morphine  (PF) 4 MG/ML injection 4 mg (4 mg Intravenous Given 12/20/23 2025)     Medical Decision Making  This patient is a 53 year old male who presents to the ED for concern of right sided chest pain radiating to back accompanied with generalized abdominal pain localizing to the epigastric region starting yesterday afternoon.  Noted to drink alcohol daily approximately 6-12 beers with last drink noted last night.  Had vomiting this morning and persistent nausea today.  On physical exam, patient is alert and oriented x 4, no acute distress, but is mildly tachypneic with RR of 22-24.  Tachycardic with heart rate of 120s.  Notably tender to the epigastric region.  With exam otherwise unremarkable, no lower leg edema noted.  Mild tongue fasciculations noted however no body tremors.  The patient's current presentation, provided fluids as well as provide some Ativan  with concerns for possible withdrawal and placed on CIWA precautions.  Also providing some folic acid , thiamine , multivitamin.  Patient's current presentation, suspecting the anion gap likely secondary to starvation ketoacidosis with normal pH at this time.  Likely compensating with bicarb low.  Positive for THC.  Would like to 2.4.  Provided fluids and morphine  for pain control.  With patient's current presentation, believe that he would benefit from admission with likely being unstable to go home at this time with unstable vital signs, elevated anion gap.  Patient care was then transferred over to Dr. Debby with hospitalist.  Differential diagnoses prior to evaluation: The emergent differential diagnosis includes, but is not limited to, ACS, AAS, Pulmonary Embolism, Tension Pneumothorax, Esophageal Rupture, Cardiac Tamponade,  Pericarditis, Myocarditis, Pneumothorax, Pneumonia, Aortic Stenosis, CHF Exacerbation, GERD,  Esophageal Spasm,  Mallory-Weiss, Costochondritis, Musculoskeletal Chest Wall Pain, Anxiety / Panic Attack, pancreatitis, cholecystitis, hepatitis, PUD. This is not an exhaustive differential.   Past Medical History / Co-morbidities / Social History: Pancreatitis, anemia, alcohol use disorder  Additional history: Chart reviewed. Pertinent results include:   Last seen by [facility] gastroenterology on 10/29/2023  Last episode pancreatitis was in 2018.  Lab Tests/Imaging studies: I personally interpreted labs/imaging and the pertinent results include:    CBC notes anemia with hemoglobin 12.2 as well as a thrombocytopenia of 101 CMP notes a hyponatremia 130,  transaminitis, anion gap of 24 Lactic 2.4 Lipase 1484 UA does note ketones as well as bacteria with urine culture pending UDS positive for THC Salicylate level undetectable, ethanol level undetectable, troponin undetectable I-STAT venous blood gas showed normal pH with low bicarb     CT angio chest abdomen pelvis shows pancreatitis uncomplicated.   I agree with the radiologist interpretation.  Cardiac monitoring: EKG obtained and interpreted by myself and attending physician which shows: Sinus tachycardia   EKG Interpretation Date/Time:  Saturday December 20 2023 17:27:58 EDT Ventricular Rate:  108 PR Interval:  149 QRS Duration:  88 QT Interval:  317 QTC Calculation: 425 R Axis:   38  Text Interpretation: Sinus tachycardia Confirmed by Ruthe Cornet [phone]) on 12/20/2023 5:31:58 PM          Medications: I ordered medication including thiamine , Ativan , Zofran , LR, morphine , folic acid , multivitamin.  I have reviewed the patients home medicines and have made adjustments as needed.  Critical Interventions: None  Social Determinants of Health: No history of alcohol use disorder  Disposition: After consideration of the diagnostic  results and the patients response to treatment, I feel that the patient would benefit from admission with patient care transferred to Dr. Debby.  Final diagnoses:  Alcohol-induced acute pancreatitis, unspecified complication status  Starvation ketoacidosis    ED Discharge Orders     None          Beola Terrall GORMAN DEVONNA 12/20/23 2103    Ruthe Cornet, DO 12/20/23 2248

## 2023-12-20 NOTE — ED Triage Notes (Signed)
 Pt c/o SHOB, decreased appetite, pain to entire back, RT side chest and generalized abd; sxs began yesterday

## 2023-12-21 ENCOUNTER — Encounter (HOSPITAL_COMMUNITY): Payer: Self-pay | Admitting: Internal Medicine

## 2023-12-21 ENCOUNTER — Inpatient Hospital Stay (HOSPITAL_COMMUNITY)

## 2023-12-21 DIAGNOSIS — Z7409 Other reduced mobility: Secondary | ICD-10-CM | POA: Diagnosis present

## 2023-12-21 DIAGNOSIS — K760 Fatty (change of) liver, not elsewhere classified: Secondary | ICD-10-CM | POA: Diagnosis present

## 2023-12-21 DIAGNOSIS — I5021 Acute systolic (congestive) heart failure: Secondary | ICD-10-CM | POA: Diagnosis present

## 2023-12-21 DIAGNOSIS — I429 Cardiomyopathy, unspecified: Secondary | ICD-10-CM | POA: Diagnosis present

## 2023-12-21 DIAGNOSIS — T730XXA Starvation, initial encounter: Secondary | ICD-10-CM | POA: Diagnosis not present

## 2023-12-21 DIAGNOSIS — K219 Gastro-esophageal reflux disease without esophagitis: Secondary | ICD-10-CM | POA: Diagnosis present

## 2023-12-21 DIAGNOSIS — F1290 Cannabis use, unspecified, uncomplicated: Secondary | ICD-10-CM | POA: Diagnosis present

## 2023-12-21 DIAGNOSIS — Z79899 Other long term (current) drug therapy: Secondary | ICD-10-CM | POA: Diagnosis not present

## 2023-12-21 DIAGNOSIS — E871 Hypo-osmolality and hyponatremia: Secondary | ICD-10-CM | POA: Diagnosis present

## 2023-12-21 DIAGNOSIS — D539 Nutritional anemia, unspecified: Secondary | ICD-10-CM | POA: Diagnosis present

## 2023-12-21 DIAGNOSIS — F17210 Nicotine dependence, cigarettes, uncomplicated: Secondary | ICD-10-CM | POA: Diagnosis present

## 2023-12-21 DIAGNOSIS — I5020 Unspecified systolic (congestive) heart failure: Secondary | ICD-10-CM | POA: Diagnosis not present

## 2023-12-21 DIAGNOSIS — I4711 Inappropriate sinus tachycardia, so stated: Secondary | ICD-10-CM | POA: Diagnosis present

## 2023-12-21 DIAGNOSIS — R9431 Abnormal electrocardiogram [ECG] [EKG]: Secondary | ICD-10-CM | POA: Diagnosis not present

## 2023-12-21 DIAGNOSIS — E876 Hypokalemia: Secondary | ICD-10-CM | POA: Diagnosis present

## 2023-12-21 DIAGNOSIS — K852 Alcohol induced acute pancreatitis without necrosis or infection: Secondary | ICD-10-CM | POA: Diagnosis present

## 2023-12-21 DIAGNOSIS — E8729 Other acidosis: Secondary | ICD-10-CM | POA: Diagnosis present

## 2023-12-21 DIAGNOSIS — K859 Acute pancreatitis without necrosis or infection, unspecified: Secondary | ICD-10-CM | POA: Diagnosis not present

## 2023-12-21 DIAGNOSIS — F1020 Alcohol dependence, uncomplicated: Secondary | ICD-10-CM | POA: Diagnosis present

## 2023-12-21 DIAGNOSIS — R0602 Shortness of breath: Secondary | ICD-10-CM | POA: Diagnosis present

## 2023-12-21 DIAGNOSIS — R000 Tachycardia, unspecified: Secondary | ICD-10-CM | POA: Diagnosis not present

## 2023-12-21 LAB — FOLATE: Folate: >20 ng/mL (ref >5.9)

## 2023-12-21 LAB — LIPID PANEL
Cholesterol: 181 mg/dL (ref 0–200)
HDL: 41 mg/dL (ref >40)
LDL Cholesterol: 128 mg/dL — ABNORMAL HIGH (ref 0–99)
Total CHOL/HDL Ratio: 4.4 ratio
Triglycerides: 62 mg/dL (ref <150)
VLDL: 12 mg/dL (ref 0–40)

## 2023-12-21 LAB — COMPREHENSIVE METABOLIC PANEL WITH GFR
ALT: 39 U/L (ref 0–44)
AST: 54 U/L — ABNORMAL HIGH (ref 15–41)
Albumin: 3.5 g/dL (ref 3.5–5.0)
Alkaline Phosphatase: 104 U/L (ref 38–126)
Anion gap: 15 (ref 5–15)
BUN: 7 mg/dL (ref 6–20)
CO2: 18 mmol/L — ABNORMAL LOW (ref 22–32)
Calcium: 9.7 mg/dL (ref 8.9–10.3)
Chloride: 96 mmol/L — ABNORMAL LOW (ref 98–111)
Creatinine, Ser: 1.08 mg/dL (ref 0.61–1.24)
GFR, Estimated: >60 mL/min (ref >60)
Glucose, Bld: 128 mg/dL — ABNORMAL HIGH (ref 70–99)
Potassium: 3.3 mmol/L — ABNORMAL LOW (ref 3.5–5.1)
Sodium: 129 mmol/L — ABNORMAL LOW (ref 135–145)
Total Bilirubin: 1.7 mg/dL — ABNORMAL HIGH (ref 0.0–1.2)
Total Protein: 8.5 g/dL — ABNORMAL HIGH (ref 6.5–8.1)

## 2023-12-21 LAB — URINE CULTURE: Culture: NO GROWTH

## 2023-12-21 LAB — HEPATITIS PANEL, ACUTE
HCV Ab: NONREACTIVE
Hep A IgM: NONREACTIVE
Hep B C IgM: NONREACTIVE
Hepatitis B Surface Ag: NONREACTIVE

## 2023-12-21 LAB — GLUCOSE, CAPILLARY: Glucose-Capillary: 144 mg/dL — ABNORMAL HIGH (ref 70–99)

## 2023-12-21 LAB — HEMOGLOBIN A1C
Hgb A1c MFr Bld: 5.1 % (ref 4.8–5.6)
Mean Plasma Glucose: 99.67 mg/dL

## 2023-12-21 LAB — HIV ANTIBODY (ROUTINE TESTING W REFLEX): HIV Screen 4th Generation wRfx: NONREACTIVE

## 2023-12-21 LAB — VITAMIN B12: Vitamin B-12: 880 pg/mL (ref 180–914)

## 2023-12-21 LAB — MAGNESIUM: Magnesium: 1.7 mg/dL (ref 1.7–2.4)

## 2023-12-21 MED ORDER — ENSURE PLUS HIGH PROTEIN PO LIQD
237.0000 mL | Freq: Two times a day (BID) | ORAL | Status: DC
Start: 2023-12-22 — End: 2023-12-24
  Administered 2023-12-24 (×2): 237 mL via ORAL

## 2023-12-21 MED ORDER — ENOXAPARIN SODIUM 40 MG/0.4ML IJ SOSY
40.0000 mg | PREFILLED_SYRINGE | INTRAMUSCULAR | Status: DC
Start: 2023-12-21 — End: 2023-12-24
  Administered 2023-12-21 – 2023-12-24 (×4): 40 mg via SUBCUTANEOUS
  Filled 2023-12-21 (×4): qty 0.4

## 2023-12-21 MED ORDER — LACTATED RINGERS IV SOLN: INTRAVENOUS | Status: AC

## 2023-12-21 MED ORDER — NICOTINE 14 MG/24HR TD PT24
14.0000 mg | MEDICATED_PATCH | Freq: Every day | TRANSDERMAL | Status: DC
  Administered 2023-12-21 – 2023-12-24 (×4): 14 mg via TRANSDERMAL
  Filled 2023-12-21 (×4): qty 1

## 2023-12-21 NOTE — ED Notes (Signed)
 Called [facility] for transport.

## 2023-12-21 NOTE — ED Notes (Signed)
 Patient continues to come out of room and walk around nursing station.  Has been redirected back to room by nursing staff.  When asked where he is attempting to go he is unsure.  Has mentioned that he wanted to go to store.

## 2023-12-21 NOTE — Progress Notes (Signed)
   12/21/23 2013  Vitals  Temp 98 F (36.7 C)  Temp Source Oral  BP (!) 136/91  MAP (mmHg) 104  BP Location Right Arm  BP Method Automatic  Patient Position (if appropriate) Lying  Pulse Rate (!) 130  Pulse Rate Source Monitor  ECG Heart Rate (!) 130  Resp 18  Level of Consciousness  Level of Consciousness Alert  MEWS COLOR  MEWS Score Color Yellow  Oxygen Therapy  SpO2 97 %  O2 Device Room Air  Pain Assessment  Pain Scale 0-10  Pain Score 0  MEWS Score  MEWS Temp 0  MEWS Systolic 0  MEWS Pulse 3  MEWS RR 0  MEWS LOC 0  MEWS Score 3

## 2023-12-21 NOTE — ED Notes (Signed)
 Pt asked if he could leave the unit this morning. When asked what he needed, he said he wanted to smoke a cigarette. Informed patient he could not leave to smoke a cigarette, but that we could look into a nicotine  patch. Pt agreed with plan of care.

## 2023-12-21 NOTE — ED Notes (Signed)
 Patient currently sitting outside room in a wheelchair.  Family with patient. No complaints voiced.  Does not want to have monitoring equipment replaced at this time

## 2023-12-21 NOTE — ED Notes (Signed)
 Patient has removed all monitoring equipment and changed back into personal clothing.  Medicated per order.

## 2023-12-21 NOTE — ED Notes (Signed)
 RN to room to check on patient.  Pt currently sleeping in bed.  Respirations even and unlabored.  Pt not disturbed at this time due to having difficulty falling asleep.  Will check back on patient shortly.  Family remains at bedside

## 2023-12-21 NOTE — ED Notes (Signed)
 Patient walked out to nursing station requesting something to eat.  Informed patient that we did not currently have anything for him to eat.  Requested to order food and have it delivered myself and patient informed that he would not be able to do that.  Patient encouraged to return to room.  Reports he is no longer having nausea or vomiting.  Pain controlled at this time.  Pt did return to room.

## 2023-12-21 NOTE — ED Notes (Signed)
 Patient wanting all monitoring equipment off momentarily.

## 2023-12-21 NOTE — H&P (Addendum)
 History and Physical    Jamie Campos FMW:[id number] DOB: 1970-11-14 DOA: 12/20/2023  DOS: the patient was seen and examined on 12/20/2023  PCP: Arloa Elsie SAUNDERS, MD   Patient coming from: [facility]  I have personally briefly reviewed patient's old medical records in [facility] and CareEverywhere  HPI:  No notes on file   Jamie Campos is a 53 y.o. year old male with past medical history of alcohol use disorder, previous hx of pancreatitis, tobacco use disorder presenting to ED with complaint of abdominal pain and transferred to [facility] for admission.   Pt states he developed shortness of breath and right-sided chest pain on 09/26.  He is also having abdominal pain and nausea and vomiting.  He reports regular alcohol use and states he drinks around 8 beers daily and last drink was 09/26.  In the ED lab work was obtained that showed elevated lipase around 1400, CBC without leukocytosis, CMP with elevated LFTs and bicarb at 16.  Ethanol level was checked and was negative.  Given shortness of breath CTA was done that did not show PE but did show findings consistent with acute pancreatitis without any fluid collection, pseudocyst or an abscess.  TRH contacted for admission.  Review of Systems: As mentioned in the history of present illness. All other systems reviewed and are negative.  Past Medical History:  Diagnosis Date   Acute pancreatitis    Alcohol use disorder    Anemia    PNA (pneumonia)    Rib fractures     Past Surgical History:  Procedure Laterality Date   ROTATOR CUFF REPAIR       reports that he has been smoking cigarettes. He has never used smokeless tobacco. He reports current alcohol use of about 3.0 standard drinks of alcohol per week. He reports that he does not use drugs.  No Known Allergies  History reviewed. No pertinent family history.  Social Hx: Lives with son and partner 1 ppd for 30 hx, 4 drinks of vodka 4 times a week and 6-8 beers daily 4  times a week.    Prior to Admission medications   Medication Sig Start Date End Date Taking? Authorizing Provider  bisacodyl  (DULCOLAX) 5 MG EC tablet Take 1 tablet (5 mg total) by mouth as directed. 10/01/22     carvedilol  (COREG ) 3.125 MG tablet Take 1 tablet (3.125 mg total) by mouth 2 (two) times daily with a meal. 08/08/22   Juvenal Harlene PENNER, DO  lidocaine  (LIDODERM ) 5 % Place 2 patches onto the skin daily. Remove & Discard patch within 12 hours or as directed by MD 08/08/22   Juvenal Harlene PENNER, DO  nicotine  (NICODERM CQ ) 21 mg/24hr patch Instill 1 patch onto clean, dry, intact skin once daily as directed 10/08/22     omeprazole  (PRILOSEC) 40 MG capsule Take 1 capsule (40mg ) by mouth one-half to 1 hour before morning meal once a day. 11/28/23     PEG 3350 -KCl-NaBcb-NaCl-NaSulf (PEG-3350/ELECTROLYTES) 236 g SOLR Use as directed. 11/28/23   Brahmbhatt, Parag, MD  polyethylene glycol-electrolytes (NULYTELY ) 420 g solution Take 4,000 mLs by mouth as directed. 10/01/22       Physical Exam: Vitals:   12/21/23 0736 12/21/23 0935 12/21/23 1153 12/21/23 1220  BP: (!) 163/105 (!) 152/103  (!) 159/103  Pulse: (!) 128 (!) 138  (!) 130  Resp: 18 17 18 18   Temp:   98.7 F (37.1 C) 98 F (36.7 C)  TempSrc:   Oral Oral  SpO2: 96% 97%  97%  Weight:      Height:        Physical Exam: Gen: NAD HENT: Dry MM CV: sinus tachycardia, good pulses Resp: CTAB Abd: TTP of epigastric region MSK: good bulk and tone, no asymmetry Neuro: somnolent but oriented x4 Psych: normal mood   Labs on Admission: I have personally reviewed following labs and imaging studies  CBC: Recent Labs  Lab 12/20/23 1831 12/20/23 1950  WBC 6.0  --   NEUTROABS 5.0  --   HGB 12.2* 12.6*  HCT 34.3* 37.0*  MCV 100.3*  --   PLT 101*  --    Basic Metabolic Panel: Recent Labs  Lab 12/20/23 1753 12/20/23 1950  NA 130* 128*  K 3.8 3.8  CL 91*  --   CO2 16*  --   GLUCOSE 126*  --   BUN 8  --   CREATININE 1.19  --   CALCIUM  10.2  --    GFR: Estimated Creatinine Clearance: 74.3 mL/min (by C-G formula based on SCr of 1.19 mg/dL). Liver Function Tests: Recent Labs  Lab 12/20/23 1753  AST 105*  ALT 56*  ALKPHOS 163*  BILITOT 1.2  PROT 9.7*  ALBUMIN 4.9   Recent Labs  Lab 12/20/23 1753  LIPASE 1,484*   No results for input(s): AMMONIA in the last 168 hours. Coagulation Profile: Recent Labs  Lab 12/20/23 1831  INR 1.0   Cardiac Enzymes: No results for input(s): CKTOTAL, CKMB, CKMBINDEX, TROPONINI, TROPONINIHS in the last 168 hours. BNP (last 3 results) No results for input(s): BNP in the last 8760 hours. HbA1C: No results for input(s): HGBA1C in the last 72 hours. CBG: No results for input(s): GLUCAP in the last 168 hours. Lipid Profile: No results for input(s): CHOL, HDL, LDLCALC, TRIG, CHOLHDL, LDLDIRECT in the last 72 hours. Thyroid Function Tests: No results for input(s): TSH, T4TOTAL, FREET4, T3FREE, THYROIDAB in the last 72 hours. Anemia Panel: No results for input(s): VITAMINB12, FOLATE, FERRITIN, TIBC, IRON, RETICCTPCT in the last 72 hours. Urine analysis:    Component Value Date/Time   COLORURINE YELLOW 12/20/2023 1844   APPEARANCEUR CLEAR 12/20/2023 1844   LABSPEC 1.020 12/20/2023 1844   PHURINE 5.5 12/20/2023 1844   GLUCOSEU NEGATIVE 12/20/2023 1844   HGBUR SMALL (A) 12/20/2023 1844   BILIRUBINUR SMALL (A) 12/20/2023 1844   KETONESUR >=80 (A) 12/20/2023 1844   PROTEINUR >=300 (A) 12/20/2023 1844   UROBILINOGEN 1.0 03/06/2014 1014   NITRITE NEGATIVE 12/20/2023 1844   LEUKOCYTESUR NEGATIVE 12/20/2023 1844    Radiological Exams on Admission: I have personally reviewed images CT Angio Chest/Abd/Pel for Dissection W and/or Wo Contrast Result Date: 12/20/2023 CLINICAL DATA:  Short of breath, decreased appetite, right-sided chest pain, generalized abdominal pain EXAM: CT ANGIOGRAPHY CHEST, ABDOMEN AND PELVIS TECHNIQUE: Initial  noncontrast CT of the chest was obtained. Multidetector CT imaging through the chest, abdomen and pelvis was performed using the standard protocol during bolus administration of intravenous contrast. Multiplanar reconstructed images and MIPs were obtained and reviewed to evaluate the vascular anatomy. RADIATION DOSE REDUCTION: This exam was performed according to the departmental dose-optimization program which includes automated exposure control, adjustment of the mA and/or kV according to patient size and/or use of iterative reconstruction technique. CONTRAST:  OMNIPAQUE  IOHEXOL  350 MG/ML SOLN COMPARISON:  08/06/2022 FINDINGS: CTA CHEST FINDINGS Cardiovascular: No evidence of thoracic aortic aneurysm or dissection. Atherosclerosis of the aortic arch and descending thoracic aorta. There is technically adequate opacification of the pulmonary  vasculature. No filling defects or pulmonary emboli. The heart is unremarkable without pericardial effusion. Mediastinum/Nodes: No enlarged mediastinal, hilar, or axillary lymph nodes. Thyroid gland, trachea, and esophagus demonstrate no significant findings. Lungs/Pleura: No acute airspace disease, effusion, or pneumothorax. Central airways are patent. 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 No acute displaced fractures. Reconstructed images demonstrate no additional findings. Review of the MIP images confirms the above findings. CTA ABDOMEN AND PELVIS FINDINGS VASCULAR Aorta: Normal caliber aorta without aneurysm, dissection, vasculitis or significant stenosis. Diffuse atherosclerosis. Celiac: Patent without evidence of aneurysm, dissection, vasculitis or significant stenosis. SMA: Patent without evidence of aneurysm, dissection, vasculitis or significant stenosis. Renals: Both renal arteries are patent without evidence of aneurysm, dissection, vasculitis, fibromuscular dysplasia or significant stenosis. IMA: Patent  without evidence of aneurysm, dissection, vasculitis or significant stenosis. Inflow: Stable irregular atheromatous plaque within the right common iliac artery, with estimated 50% stenosis unchanged. No other significant stenosis, aneurysm, dissection, or vasculitis. Veins: No obvious venous abnormality within the limitations of this arterial phase study. Review of the MIP images confirms the above findings. NON-VASCULAR Hepatobiliary: Diffuse hepatic steatosis. No focal liver abnormality. The gallbladder is unremarkable. Pancreas: Prominent edema within the pancreatic head, with significant peripancreatic fat stranding, consistent with acute uncomplicated pancreatitis. No fluid collection, pseudocyst, or abscess. No pancreatic duct dilation. Spleen: Normal in size without focal abnormality. Adrenals/Urinary Tract: Adrenal glands are unremarkable. Kidneys are normal, without renal calculi, focal lesion, or hydronephrosis. Bladder is unremarkable. Stomach/Bowel: No bowel obstruction or ileus. Normal appendix right lower quadrant. Scattered sigmoid diverticulosis without diverticulitis. No bowel wall thickening. Lymphatic: No pathologic adenopathy. Reproductive: Prostate is unremarkable. Other: No free fluid or free intraperitoneal gas. No abdominal wall hernia. Musculoskeletal: No acute or destructive bony abnormalities. Reconstructed images demonstrate no additional findings. Review of the MIP images confirms the above findings. IMPRESSION: Vascular: 1. No evidence of thoracoabdominal aortic aneurysm or dissection. 2. No evidence of pulmonary embolus. 3.  Aortic Atherosclerosis (ICD10-I70.0). 4. 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 Nonvascular: 1. Acute uncomplicated pancreatitis. No fluid collection, pseudocyst, or abscess. 2. Hepatic steatosis. 3. Sigmoid diverticulosis without diverticulitis. Electronically Signed   By: Ozell Daring M.D.   On: 12/20/2023 19:28    EKG: My  personal interpretation of EKG shows: sinus tachycardia without any abnormalities.     Assessment/Plan Principal Problem:   Acute pancreatitis Active Problems:   Abnormal LFTs   Alcohol use disorder Acute Pancreatitis Alcohol use disorder Pt with acute pancreatitis that appears to be alcohol induced. Will repeat lactic acid given it was elevated. Suspect secondary to dehydration.  -Obtain RUQ Ultrasound, Lipid Panel -S/p 1.5 L LR, and currently on 150 cc/hr LR -ADAT -Pain control with oxy 5 mg q4hrs, IV dilaudid 0.5 mg q2hrs prn for severe pain, currently pain is well controlled.  -Counseled patient on alcohol cessation.  -Replete K>4 and Mag>2  Sinus tachycardia: 2/2 to acute pancreatitis, will monitor as it will improve with IVF.   Elevated LFTs: likely 2/2 to alcohol use. Will follow up on ordered RUQ ultrasound and acute hep panel.   Hyponatremia: Mild, 2/2 to decreased PO intake. trend.   Chronic Problems: GERD: continue home prilosec 40 mg every day. HTN: Monitor BP, not on any OP meds for this  Tobacco use disorder: 1 ppd for 30 years. Ordered NRT.    VTE prophylaxis:  Lovenox   Diet: NPO, ADAT Access: PIV Lines: Code Status:  Full Code Telemetry:  Admission status:  Inpatient, Progressive Patient is from: Home  Anticipated d/c is to: Home  Anticipated d/c date is: 2 days    Family Communication: Updated at bedside   Consults called: None    Severity of Illness: The appropriate patient status for this patient is INPATIENT. Inpatient status is judged to be reasonable and necessary in order to provide the required intensity of service to ensure the patient's safety. The patient's presenting symptoms, physical exam findings, and initial radiographic and laboratory data in the context of their chronic comorbidities is felt to place them at high risk for further clinical deterioration. Furthermore, it is not anticipated that the patient will be medically stable for  discharge from the hospital within 2 midnights of admission.   * I certify that at the point of admission it is my clinical judgment that the patient will require inpatient hospital care spanning beyond 2 midnights from the point of admission due to high intensity of service, high risk for further deterioration and high frequency of surveillance required.DEWAINE Morene Bathe, MD Jolynn DEL. [facility]

## 2023-12-21 NOTE — ED Notes (Signed)
 Patient doesn't want to have monitoring equipment replaced at this time

## 2023-12-21 NOTE — ED Notes (Signed)
 Patient becoming anxious, unable to sleep, pacing in room.

## 2023-12-22 DIAGNOSIS — K859 Acute pancreatitis without necrosis or infection, unspecified: Secondary | ICD-10-CM | POA: Diagnosis not present

## 2023-12-22 LAB — COMPREHENSIVE METABOLIC PANEL WITH GFR
ALT: 35 U/L (ref 0–44)
AST: 56 U/L — ABNORMAL HIGH (ref 15–41)
Albumin: 2.9 g/dL — ABNORMAL LOW (ref 3.5–5.0)
Alkaline Phosphatase: 99 U/L (ref 38–126)
Anion gap: 10 (ref 5–15)
BUN: 5 mg/dL — ABNORMAL LOW (ref 6–20)
CO2: 19 mmol/L — ABNORMAL LOW (ref 22–32)
Calcium: 9.4 mg/dL (ref 8.9–10.3)
Chloride: 99 mmol/L (ref 98–111)
Creatinine, Ser: 0.95 mg/dL (ref 0.61–1.24)
GFR, Estimated: >60 mL/min (ref >60)
Glucose, Bld: 100 mg/dL — ABNORMAL HIGH (ref 70–99)
Potassium: 3.7 mmol/L (ref 3.5–5.1)
Sodium: 128 mmol/L — ABNORMAL LOW (ref 135–145)
Total Bilirubin: 1.6 mg/dL — ABNORMAL HIGH (ref 0.0–1.2)
Total Protein: 7.6 g/dL (ref 6.5–8.1)

## 2023-12-22 LAB — LIPASE, BLOOD: Lipase: 532 U/L — ABNORMAL HIGH (ref 11–51)

## 2023-12-22 LAB — CBC
HCT: 36.4 % — ABNORMAL LOW (ref 39.0–52.0)
Hemoglobin: 12.9 g/dL — ABNORMAL LOW (ref 13.0–17.0)
MCH: 35.9 pg — ABNORMAL HIGH (ref 26.0–34.0)
MCHC: 35.4 g/dL (ref 30.0–36.0)
MCV: 101.4 fL — ABNORMAL HIGH (ref 80.0–100.0)
Platelets: 114 K/uL — ABNORMAL LOW (ref 150–400)
RBC: 3.59 MIL/uL — ABNORMAL LOW (ref 4.22–5.81)
RDW: 13.9 % (ref 11.5–15.5)
WBC: 6.8 K/uL (ref 4.0–10.5)
nRBC: 0 % (ref 0.0–0.2)

## 2023-12-22 LAB — GLUCOSE, CAPILLARY
Glucose-Capillary: 107 mg/dL — ABNORMAL HIGH (ref 70–99)
Glucose-Capillary: 111 mg/dL — ABNORMAL HIGH (ref 70–99)
Glucose-Capillary: 118 mg/dL — ABNORMAL HIGH (ref 70–99)
Glucose-Capillary: 123 mg/dL — ABNORMAL HIGH (ref 70–99)

## 2023-12-22 MED ORDER — MAGNESIUM SULFATE 2 GM/50ML IV SOLN
2.0000 g | Freq: Once | INTRAVENOUS | Status: AC
  Administered 2023-12-22: 2 g via INTRAVENOUS
  Filled 2023-12-22: qty 50

## 2023-12-22 MED ORDER — LACTATED RINGERS IV SOLN: INTRAVENOUS | Status: AC

## 2023-12-22 MED ORDER — POTASSIUM CHLORIDE CRYS ER 20 MEQ PO TBCR
60.0000 meq | EXTENDED_RELEASE_TABLET | Freq: Once | ORAL | Status: AC
  Administered 2023-12-22: 60 meq via ORAL
  Filled 2023-12-22: qty 3

## 2023-12-22 NOTE — Progress Notes (Signed)
 Pts HR remains elevated, sustaining 130's at rest but up to 170's with minimal activity. Pt essentially asymptomatic. He has tolerated liquids by mouth and has eaten several packs of No Salt Saltine crackers without c/o abdominal pain or nausea.

## 2023-12-22 NOTE — TOC Initial Note (Signed)
 Transition of Care [facility]) - Initial/Assessment Note    Patient Details  Name: Jamie Campos MRN: [id number] Date of Birth: 10-20-1970  Transition of Care [facility]) CM/SW Contact:    Jamie Campos Phone Number: 12/22/2023, 12:32 PM  Clinical Narrative:                  CSW spoke with patient and with patients permission his mother Jamie Campos who was at bedside. Patient reports PTA he comes from home with spouse and son. Patient confirmed his plan at dc is to return home. Patient reports his mother will transport him back home when medically ready for dc. Patient reports he has a PCP Jamie Campos, and that he uses [facility] pharmacy. CSW offered patient outpatient substance use treatment services resources. Patient politely declined. All questions answered. No further questions reported at this time.       Patient Goals and CMS Choice            Expected Discharge Plan and Services                                              Prior Living Arrangements/Services                       Activities of Daily Living   ADL Screening (condition at time of admission) Independently performs ADLs?: Yes (appropriate for developmental age) Is the patient deaf or have difficulty hearing?: No Does the patient have difficulty seeing, even when wearing glasses/contacts?: No Does the patient have difficulty concentrating, remembering, or making decisions?: No  Permission Sought/Granted                  Emotional Assessment              Admission diagnosis:  Acute pancreatitis [K85.90] Starvation ketoacidosis [T73.0XXA, E87.29] Alcohol-induced acute pancreatitis, unspecified complication status [K85.20] Patient Active Problem List   Diagnosis Date Noted   Acute recurrent pancreatitis 12/20/2023   Tobacco use 08/07/2022   Pneumonia 08/07/2022   Community acquired pneumonia 08/06/2022   Abnormal LFTs 08/06/2022   Alcohol use disorder  08/06/2022   Rib fractures 06/18/2022   Multiple rib fractures 06/16/2022   PCP:  Campos Jamie SAUNDERS, Campos Pharmacy:   [facility] [place] [facility] [address] [place] [address] Phone: [phone] Fax: [phone]  [place] [facility] [address] [place] [address] Phone: [phone] Fax: [phone]     Social Drivers of Health (SDOH) Social History: SDOH Screenings   Food Insecurity: Patient Declined (12/21/2023)  Housing: Patient Declined (12/21/2023)  Transportation Needs: Patient Declined (12/21/2023)  Utilities: Patient Declined (12/21/2023)  Social Connections: Patient Declined (12/21/2023)  Tobacco Use: High Risk (12/21/2023)   SDOH Interventions:     Readmission Risk Interventions     No data to display

## 2023-12-22 NOTE — Progress Notes (Signed)
 Patient advanced to soft diet. Soft diet was tried for lunch and patient had no complaints of abdominal pain, nausea, or vomiting.

## 2023-12-22 NOTE — Progress Notes (Signed)
 PROGRESS NOTE  [name]  DOB: June 10, 1970  PCP: Arloa Elsie SAUNDERS, MD FMW:[id number]  DOA: 12/20/2023  LOS: 1 day  Hospital Day: 3  Brief narrative: Jamie Campos is a 53 y.o. male with PMH significant for chronic alcoholism, smoking, h/o alcoholic pancreatitis chronic anemia. 9/27, patient presented to ED at [facility] with complaint of abdominal pain radiating to the back, nausea, vomiting, inability to oral intake for more than 24 hours. Drinks about 8 beers per day, last drink was on 9/26.  In the ED, patient was afebrile, tachycardic to 100s, blood pressure 155/105, breathing on room air Initial labs reviewed WBC count hemoglobin 12.2, sodium 130, serum bicarb 16, renal function normal, lipase elevated to 1484, AST/ALT/alk phos also elevated, total bilirubin normal, troponin negative. Blood alcohol level and salicylate level were not elevated UDS positive for THC CT angio chest/abdomen/pelvis showed acute uncomplicated pancreatitis without fluid collection, pseudocyst or abscess. Patient was started on conservative management with IV fluid, IV pain meds Admitted to [facility]  Subjective: Patient was seen and examined this morning. Lying on bed.  Not in distress.  Not in withdrawal symptoms currently.  Tachycardic however.  Mother at bedside.  Sister on the phone Overnight, afebrile, heart rate in 130s, blood pressure 120s, breathing room air Labs this morning showed sodium 128, repeat labs level pending, triglycerides 62, serum bicarb 19, sodium 128  Assessment and plan: Acute alcoholic pancreatitis Patient with abdomen, nausea, vomiting in the setting of chronic alcohol use Lipase level elevated.  CT with evidence of uncomplicated pancreatitis Currently on conservative management with IV fluid, IV analgesics, IV antiemetics, IV Protonix It seems patient is currently on full liquid. Abdomen pain improving.  No vomiting.  Has been tolerating a liquid diet. Repeat lipase level  this morning is better at 532 Continue pain control with as needed IV Dilaudid.  Elevated liver enzymes Hepatic steatosis Likely d/t chronic alcoholism. AHP nonreactive. LFT trend as below, improving. Recent Labs  Lab 12/20/23 1753 12/20/23 1831 12/21/23 1522 12/22/23 0539  AST 105*  --  54* 56*  ALT 56*  --  39 35  ALKPHOS 163*  --  104 99  BILITOT 1.2  --  1.7* 1.6*  PROT 9.7*  --  8.5* 7.6  ALBUMIN 4.9  --  3.5 2.9*  INR  --  1.0  --   --   LIPASE 1,484*  --   --  532*  PLT  --  101*  --  114*   Hypokalemia Improved with replacement Recent Labs  Lab 12/20/23 1753 12/20/23 1950 12/21/23 1522 12/22/23 0539  K 3.8 3.8 3.3* 3.7  MG  --   --  1.7  --    Hyponatremia Sodium level running close to 130 in the setting of chronic alcohol use.  Probably hypovolemic hyponatremia Continue to monitor with IV hydration. Recent Labs  Lab 12/20/23 1753 12/20/23 1950 12/21/23 1522 12/22/23 0539  NA 130* 128* 129* 128*   Sinus tachycardia Likely 2/2 to acute pancreatitis Monitor with IV fluid  Chronic alcoholism At risk of alcohol withdrawal Drinks about 8 beers per day, last drink was on 9/26.SABRA Counseled to quit. Monitor for withdrawal symptoms.  CIWA protocol    Impaired mobility PT eval ordered   Mobility:  PT Orders:   PT Follow up Rec:    Goals of care   Code Status: Full Code     DVT prophylaxis:  enoxaparin  (LOVENOX ) injection 40 mg Start: 12/21/23 1500   Antimicrobials: None  Fluid: LR at 150 mL/h to continue Consultants: None Family Communication: Family at bedside  Status: Inpatient Level of care:  Progressive   Patient is from: Home Needs to continue in-hospital care: Ongoing conservative management of acute pancreatitis Anticipated d/c to: Home hopefully 1 to 2 days      Diet:  Diet Order             DIET SOFT Room service appropriate? Yes; Fluid consistency: Thin  Diet effective now                   Scheduled Meds:   enoxaparin  (LOVENOX ) injection  40 mg Subcutaneous Q24H   feeding supplement  237 mL Oral BID BM   folic acid   1 mg Oral Daily   multivitamin with minerals  1 tablet Oral Daily   nicotine   14 mg Transdermal Daily   thiamine   100 mg Oral Daily   Or   thiamine   100 mg Intravenous Daily    PRN meds:    Infusions:   lactated ringers       Antimicrobials: Anti-infectives (From admission, onward)    None       Objective: Vitals:   12/22/23 0320 12/22/23 0749  BP: (!) 123/95 125/82  Pulse: (!) 137 (!) 118  Resp: 19 20  Temp: 97.9 F (36.6 C) 97.8 F (36.6 C)  SpO2: 97% 94%    Intake/Output Summary (Last 24 hours) at 12/22/2023 1107 Last data filed at 12/22/2023 0300 Gross per 24 hour  Intake 1886.13 ml  Output 500 ml  Net 1386.13 ml   Filed Weights   12/20/23 1715  Weight: 81.6 kg   Weight change:  Body mass index is 26.58 kg/m.   Physical Exam: General exam: Pleasant, middle-aged African-American male Skin: No rashes, lesions or ulcers. HEENT: Atraumatic, normocephalic, no obvious bleeding Lungs: Clear to auscultation bilaterally,  CVS: Sinus tachycardia, S1, S2, no murmur,   GI/Abd: Soft, mild epigastric tenderness, nondistended, bowel sound present. CNS: Alert, awake, oriented x 3.  Mild withdrawal symptoms Psychiatry: Mood appropriate Extremities: No pedal edema, no calf tenderness,   Data Review: I have personally reviewed the laboratory data and studies available.  F/u labs  Unresulted Labs (From admission, onward)     Start     Ordered   12/23/23 0500  CBC with Differential/Platelet  Tomorrow morning,   R        12/22/23 1106   12/23/23 0500  Comprehensive metabolic panel with GFR  Tomorrow morning,   R        12/22/23 1106   12/23/23 0500  Lipase, blood  Tomorrow morning,   R        12/22/23 1106   12/20/23 1730  CBC with Differential  Once,   STAT        12/20/23 1734            Signed, Chapman Rota, MD [facility] 12/22/2023

## 2023-12-22 NOTE — Plan of Care (Signed)

## 2023-12-23 ENCOUNTER — Inpatient Hospital Stay (HOSPITAL_COMMUNITY)

## 2023-12-23 DIAGNOSIS — R9431 Abnormal electrocardiogram [ECG] [EKG]: Secondary | ICD-10-CM | POA: Diagnosis not present

## 2023-12-23 DIAGNOSIS — K859 Acute pancreatitis without necrosis or infection, unspecified: Secondary | ICD-10-CM | POA: Diagnosis not present

## 2023-12-23 LAB — CBC WITH DIFFERENTIAL/PLATELET
Abs Immature Granulocytes: 0.02 K/uL (ref 0.00–0.07)
Basophils Absolute: 0 K/uL (ref 0.0–0.1)
Basophils Relative: 0 %
Eosinophils Absolute: 0 K/uL (ref 0.0–0.5)
Eosinophils Relative: 1 %
HCT: 28.8 % — ABNORMAL LOW (ref 39.0–52.0)
Hemoglobin: 10.2 g/dL — ABNORMAL LOW (ref 13.0–17.0)
Immature Granulocytes: 0 %
Lymphocytes Relative: 14 %
Lymphs Abs: 0.8 K/uL (ref 0.7–4.0)
MCH: 35.2 pg — ABNORMAL HIGH (ref 26.0–34.0)
MCHC: 35.4 g/dL (ref 30.0–36.0)
MCV: 99.3 fL (ref 80.0–100.0)
Monocytes Absolute: 0.6 K/uL (ref 0.1–1.0)
Monocytes Relative: 10 %
Neutro Abs: 4.2 K/uL (ref 1.7–7.7)
Neutrophils Relative %: 75 %
Platelets: 120 K/uL — ABNORMAL LOW (ref 150–400)
RBC: 2.9 MIL/uL — ABNORMAL LOW (ref 4.22–5.81)
RDW: 13.8 % (ref 11.5–15.5)
WBC: 5.6 K/uL (ref 4.0–10.5)
nRBC: 0 % (ref 0.0–0.2)

## 2023-12-23 LAB — COMPREHENSIVE METABOLIC PANEL WITH GFR
ALT: 33 U/L (ref 0–44)
AST: 49 U/L — ABNORMAL HIGH (ref 15–41)
Albumin: 2.5 g/dL — ABNORMAL LOW (ref 3.5–5.0)
Alkaline Phosphatase: 87 U/L (ref 38–126)
Anion gap: 12 (ref 5–15)
BUN: 5 mg/dL — ABNORMAL LOW (ref 6–20)
CO2: 22 mmol/L (ref 22–32)
Calcium: 8.8 mg/dL — ABNORMAL LOW (ref 8.9–10.3)
Chloride: 97 mmol/L — ABNORMAL LOW (ref 98–111)
Creatinine, Ser: 0.83 mg/dL (ref 0.61–1.24)
GFR, Estimated: >60 mL/min (ref >60)
Glucose, Bld: 86 mg/dL (ref 70–99)
Potassium: 3.1 mmol/L — ABNORMAL LOW (ref 3.5–5.1)
Sodium: 131 mmol/L — ABNORMAL LOW (ref 135–145)
Total Bilirubin: 1.5 mg/dL — ABNORMAL HIGH (ref 0.0–1.2)
Total Protein: 5.2 g/dL — ABNORMAL LOW (ref 6.5–8.1)

## 2023-12-23 LAB — ECHOCARDIOGRAM COMPLETE
Height: 69 in
S' Lateral: 4 cm
Weight: 2880 [oz_av]

## 2023-12-23 LAB — LIPASE, BLOOD: Lipase: 557 U/L — ABNORMAL HIGH (ref 11–51)

## 2023-12-23 LAB — GLUCOSE, CAPILLARY
Glucose-Capillary: 89 mg/dL (ref 70–99)
Glucose-Capillary: 114 mg/dL — ABNORMAL HIGH (ref 70–99)

## 2023-12-23 MED ORDER — PERFLUTREN LIPID MICROSPHERE
1.0000 mL | INTRAVENOUS | Status: AC | PRN
  Administered 2023-12-23: 4 mL via INTRAVENOUS

## 2023-12-23 MED ORDER — POTASSIUM CHLORIDE CRYS ER 20 MEQ PO TBCR
40.0000 meq | EXTENDED_RELEASE_TABLET | ORAL | Status: AC
  Administered 2023-12-23 (×2): 40 meq via ORAL
  Filled 2023-12-23 (×2): qty 2

## 2023-12-23 MED ORDER — LACTATED RINGERS IV SOLN: INTRAVENOUS | Status: DC

## 2023-12-23 NOTE — Evaluation (Signed)
 Physical Therapy Evaluation Patient Details Name: Jamie Campos MRN: [id number] DOB: Apr 26, 1970 Today's Date: 12/23/2023  History of Present Illness  53 yo M adm 12/20/23 with abdominal pain R-sided chest pain radiating to his back. Work up revealed sinus tach likely due to acute pancreatitis which is due to alcohol use. PMhx: ETOH use disorder, tobacco use, pancreatitis, and anemia.   Clinical Impression  Pt in bed upon arrival of PT, agreeable to evaluation at this time. Prior to admission the pt was completely independent without DME, living with his family and working as a [employer]. The pt was able to complete all mobility independently with good stability, but does continue to have significant elevations in HR with any mobility, up to 165bpm with hallway ambulation with pt reporting no sx or awareness. The pt will benefit from continued mobility and monitoring of HR, encouraged use of wearable HR monitor to guide rest breaks and activity modification at home after d/c. Pt verbalized understanding. No evidence of instability or need for DME, pt without distress and asymptomatic despite HR elevation, no follow up PT needs identified at this time.   VITALS:  HR at rest in bed: 118-125bpm HR with static standing: 135-140bpm HR with 200 ft hallway ambulation: 135-164bpm (mostly 150-164)    If plan is discharge home, recommend the following:     Can travel by private vehicle    yes    Equipment Recommendations None recommended by PT  Recommendations for Other Services       Functional Status Assessment Patient has not had a recent decline in their functional status     Precautions / Restrictions Precautions Precautions: None Recall of Precautions/Restrictions: Intact Precaution/Restrictions Comments: watch HR elevation Restrictions Weight Bearing Restrictions Per Provider Order: No      Mobility  Bed Mobility Overal bed mobility: Independent                   Transfers Overall transfer level: Independent Equipment used: None               General transfer comment: HR increased to 135-140bpm. no instability    Ambulation/Gait Ambulation/Gait assistance: Supervision Gait Distance (Feet): 250 Feet Assistive device: None Gait Pattern/deviations: WFL(Within Functional Limits) Gait velocity: 0.79m/s Gait velocity interpretation: 1.31 - 2.62 ft/sec, indicative of limited community ambulator   General Gait Details: slightly slowed but gait WFL, pt with no distress and reports no heart racing or palpitations however HR 150-165bpm.  Stairs Stairs:  (deferred due to HR elevation to 165bpm)            Balance Overall balance assessment: No apparent balance deficits (not formally assessed)                               Standardized Balance Assessment Standardized Balance Assessment : Dynamic Gait Index   Dynamic Gait Index Level Surface: Normal Change in Gait Speed: Normal Gait with Horizontal Head Turns: Normal Gait with Vertical Head Turns: Normal Gait and Pivot Turn: Normal Step Over Obstacle: Normal Step Around Obstacles: Normal Steps: Mild Impairment Total Score: 23       Pertinent Vitals/Pain Pain Assessment Pain Assessment: No/denies pain    Home Living Family/patient expects to be discharged to:: Private residence Living Arrangements: Spouse/significant other;Children (32 yo son) Available Help at Discharge: Family;Available PRN/intermittently Type of Home: House Home Access: Stairs to enter Entrance Stairs-Rails: Right;Left Entrance Stairs-Number of Steps: 3 Alternate Level Stairs-Number  of Steps: flight Home Layout: Two level;1/2 bath on main level;Bed/bath upstairs Home Equipment: Hand held shower head      Prior Function Prior Level of Function : Independent/Modified Independent;Working/employed;Driving             Mobility Comments: works as a Naval architect, no consistent exercise,  independent ADLs Comments: independent     Extremity/Trunk Assessment   Upper Extremity Assessment Upper Extremity Assessment: Right hand dominant;Overall WFL for tasks assessed    Lower Extremity Assessment Lower Extremity Assessment: Overall WFL for tasks assessed    Cervical / Trunk Assessment Cervical / Trunk Assessment: Normal  Communication   Communication Communication: No apparent difficulties    Cognition Arousal: Alert Behavior During Therapy: WFL for tasks assessed/performed   PT - Cognitive impairments: No apparent impairments                         Following commands: Intact       Cueing Cueing Techniques: Verbal cues     General Comments General comments (skin integrity, edema, etc.): HR 118-125bpm at rest, 135-140bpm with static stance, 135-165bpm with gait. RN aware        Assessment/Plan    PT Assessment Patient needs continued PT services  PT Problem List Decreased activity tolerance       PT Treatment Interventions Gait training;Stair training;Functional mobility training;Therapeutic exercise;Therapeutic activities    PT Goals (Current goals can be found in the Care Plan section)  Acute Rehab PT Goals Patient Stated Goal: to return home PT Goal Formulation: With patient Time For Goal Achievement: 01/06/24 Potential to Achieve Goals: Good    Frequency Min 2X/week        AM-PAC PT 6 Clicks Mobility  Outcome Measure Help needed turning from your back to your side while in a flat bed without using bedrails?: None Help needed moving from lying on your back to sitting on the side of a flat bed without using bedrails?: None Help needed moving to and from a bed to a chair (including a wheelchair)?: None Help needed standing up from a chair using your arms (e.g., wheelchair or bedside chair)?: None Help needed to walk in hospital room?: None Help needed climbing 3-5 steps with a railing? : A Little 6 Click Score: 23    End  of Session Equipment Utilized During Treatment: Gait belt Activity Tolerance: Patient tolerated treatment well Patient left: in bed (sitting EOB) Nurse Communication: Mobility status;Other (comment) (HR elevation) PT Visit Diagnosis: Difficulty in walking, not elsewhere classified (R26.2)    Time: 9148-9084 PT Time Calculation (min) (ACUTE ONLY): 24 min   Charges:   PT Evaluation $PT Eval Low Complexity: 1 Low PT Treatments $Therapeutic Exercise: 8-22 mins PT General Charges $$ ACUTE PT VISIT: 1 Visit         Izetta Call, PT, DPT   Acute Rehabilitation Department Office [phone] Secure Chat Communication Preferred  Izetta JULIANNA Call 12/23/2023, 10:29 AM

## 2023-12-23 NOTE — Progress Notes (Addendum)
 PROGRESS NOTE  Jamie Campos  DOB: 1970-09-07  PCP: Arloa Elsie SAUNDERS, MD FMW:[id number]  DOA: 12/20/2023  LOS: 2 days  Hospital Day: 4  Brief narrative: Jamie Campos is a 53 y.o. male with PMH significant for chronic alcoholism, smoking, h/o alcoholic pancreatitis chronic anemia. 9/27, patient presented to ED at [facility] with complaint of abdominal pain radiating to the back, nausea, vomiting, inability to oral intake for more than 24 hours. Drinks about 8 beers per day, last drink was on 9/26.  In the ED, patient was afebrile, tachycardic to 100s, blood pressure 155/105, breathing on room air Initial labs reviewed WBC count hemoglobin 12.2, sodium 130, serum bicarb 16, renal function normal, lipase elevated to 1484, AST/ALT/alk phos also elevated, total bilirubin normal, troponin negative. Blood alcohol level and salicylate level were not elevated UDS positive for THC CT angio chest/abdomen/pelvis showed acute uncomplicated pancreatitis without fluid collection, pseudocyst or abscess. Patient was started on conservative management with IV fluid, IV pain meds Admitted to [facility]  Subjective: Patient was seen and examined this morning. Sitting up in bed.  Not in distress.  Not in withdrawal. Mother at bedside. Patient feels better after he was able to ambulate with PT earlier. Remains afebrile, heart rate persistently elevated over 120s, blood pressure in normal range, breathing room air Labs from this morning with sodium 131, potassium 3.1, renal function normal, lipase level at 557, hemoglobin 10.2  Assessment and plan: Acute alcoholic pancreatitis Patient with abdomen, nausea, vomiting in the setting of chronic alcohol use Lipase level was elevated.  CT with evidence of uncomplicated pancreatitis Currently on conservative management with IV fluid, IV analgesics, IV antiemetics, IV Protonix Abdomen pain improving.  No vomiting.  Has been tolerating a liquid diet. Lipase level  improved significantly yesterday but no improvement in last 24 hours Continue pain control with as needed IV Dilaudid. Tolerating liquid diet.  Wants to advance to solid today. Continue IV hydration today with LR at a lower rate of 100 mL/h  Elevated liver enzymes Hepatic steatosis Likely d/t chronic alcoholism. AHP nonreactive. LFT trend as below, improving. Recent Labs  Lab 12/20/23 1753 12/20/23 1831 12/21/23 1522 12/22/23 0539 12/23/23 0433  AST 105*  --  54* 56* 49*  ALT 56*  --  39 35 33  ALKPHOS 163*  --  104 99 87  BILITOT 1.2  --  1.7* 1.6* 1.5*  PROT 9.7*  --  8.5* 7.6 5.2*  ALBUMIN 4.9  --  3.5 2.9* 2.5*  INR  --  1.0  --   --   --   LIPASE 1,484*  --   --  532* 557*  PLT  --  101*  --  114* 120*   Chronic alcoholism At risk of alcohol withdrawal Drinks about 8 beers per day, last drink was on 9/26.SABRA Counseled to quit. Monitor for withdrawal symptoms.  CIWA protocol to continue.   Persistent sinus tachycardia Heart rate has been persistently elevated over 120 since admission.  Most likely 2/2 to acute pancreatitis.  No other evidence of alcohol withdrawal. Addendum 4:40 PM : echocardiogram showed LV global hypokinesis with EF 35 to 40%.  No prior ischemic eval.  Cardiology consulted.  To be seen tomorrow.  Since patient is tolerating oral intake, I will stop IV fluid at this time.  Hypokalemia Potassium level low at 3.1 this morning.  Replacement given Recent Labs  Lab 12/20/23 1753 12/20/23 1950 12/21/23 1522 12/22/23 0539 12/23/23 0433  K 3.8 3.8 3.3*  3.7 3.1*  MG  --   --  1.7  --   --    Hyponatremia Sodium level running close to 130 in the setting of chronic alcohol use.  Probably hypovolemic hyponatremia Continue to monitor with IV hydration. Recent Labs  Lab 12/20/23 1753 12/20/23 1950 12/21/23 1522 12/22/23 0539 12/23/23 0433  NA 130* 128* 129* 128* 131*   Chronic macrocytic anemia Secondary to alcoholism.  Drop in hemoglobin likely due  to dilution from aggressive hydration.  No active bleeding.  Continue to monitor Recent Labs    12/20/23 1831 12/20/23 1950 12/21/23 1522 12/22/23 0539 12/23/23 0433  HGB 12.2* 12.6*  --  12.9* 10.2*  MCV 100.3*  --   --  101.4* 99.3  VITAMINB12  --   --  880  --   --   FOLATE  --   --  >20.0  --   --     Impaired mobility PT eval pending   Mobility:  PT Orders:   PT Follow up Rec: No Pt Follow Up9/30/2025 0800   Goals of care   Code Status: Full Code     DVT prophylaxis:  enoxaparin  (LOVENOX ) injection 40 mg Start: 12/21/23 1500   Antimicrobials: None Fluid: LR at 100 mL/h to continue Consultants: None Family Communication: Mother at bedside  Status: Inpatient Level of care:  Progressive   Patient is from: Home Needs to continue in-hospital care: Ongoing conservative management of acute pancreatitis.  Remains tachycardic Anticipated d/c to: Home hopefully 1 to 2 days      Diet:  Diet Order             Diet regular Room service appropriate? Yes; Fluid consistency: Thin  Diet effective now                   Scheduled Meds:  enoxaparin  (LOVENOX ) injection  40 mg Subcutaneous Q24H   feeding supplement  237 mL Oral BID BM   folic acid   1 mg Oral Daily   multivitamin with minerals  1 tablet Oral Daily   nicotine   14 mg Transdermal Daily   thiamine   100 mg Oral Daily   Or   thiamine   100 mg Intravenous Daily    PRN meds:    Infusions:   lactated ringers 100 mL/hr at 12/23/23 1041      Antimicrobials: Anti-infectives (From admission, onward)    None       Objective: Vitals:   12/23/23 0805 12/23/23 1148  BP: (!) 120/103 119/79  Pulse: (!) 122 (!) 110  Resp: 20 16  Temp: 98.5 F (36.9 C) 98.6 F (37 C)  SpO2:  98%    Intake/Output Summary (Last 24 hours) at 12/23/2023 1403 Last data filed at 12/22/2023 2300 Gross per 24 hour  Intake 1756.97 ml  Output --  Net 1756.97 ml   Filed Weights   12/20/23 1715  Weight: 81.6 kg    Weight change:  Body mass index is 26.58 kg/m.   Physical Exam: General exam: Pleasant, middle-aged African-American male.  Not in pain Skin: No rashes, lesions or ulcers. HEENT: Atraumatic, normocephalic, no obvious bleeding Lungs: Clear to auscultation bilaterally,  CVS: Sinus tachycardia, S1, S2, no murmur,   GI/Abd: Soft, significant improvement in epigastric tenderness, nondistended, bowel sound present. CNS: Alert, awake, oriented x 3.  Mild withdrawal symptoms Psychiatry: Mood appropriate Extremities: No pedal edema, no calf tenderness,   Data Review: I have personally reviewed the laboratory data and studies available.  F/u labs  Unresulted Labs (From admission, onward)     Start     Ordered   12/24/23 0500  CBC with Differential/Platelet  Tomorrow morning,   R        12/23/23 1403   12/24/23 0500  Comprehensive metabolic panel with GFR  Tomorrow morning,   R        12/23/23 1403   12/20/23 1730  CBC with Differential  Once,   STAT        12/20/23 1734            Signed, Chapman Rota, MD [facility] 12/23/2023

## 2023-12-23 NOTE — Progress Notes (Signed)
   12/22/23 2100  Assess: MEWS Score  Temp 97.8 F (36.6 C)  BP 124/82  MAP (mmHg) 95  Pulse Rate (!) 129  Resp 20  SpO2 98 %  O2 Device Room Air  Assess: MEWS Score  MEWS Temp 0  MEWS Systolic 0  MEWS Pulse 2  MEWS RR 0  MEWS LOC 0  MEWS Score 2  MEWS Score Color Yellow  Assess: if the MEWS score is Yellow or Red  Were vital signs accurate and taken at a resting state? Yes  Does the patient meet 2 or more of the SIRS criteria? No  MEWS guidelines implemented  No, previously yellow, continue vital signs every 4 hours  Assess: SIRS CRITERIA  SIRS Temperature  0  SIRS Respirations  0  SIRS Pulse 1  SIRS WBC 0  SIRS Score Sum  1

## 2023-12-23 NOTE — Progress Notes (Signed)
  Echocardiogram 2D Echocardiogram has been performed.  Koleen KANDICE Popper, RDCS 12/23/2023, 11:44 AM

## 2023-12-23 NOTE — Plan of Care (Signed)

## 2023-12-24 ENCOUNTER — Other Ambulatory Visit (HOSPITAL_COMMUNITY): Payer: Self-pay

## 2023-12-24 ENCOUNTER — Other Ambulatory Visit (HOSPITAL_BASED_OUTPATIENT_CLINIC_OR_DEPARTMENT_OTHER): Payer: Self-pay

## 2023-12-24 ENCOUNTER — Encounter (HOSPITAL_COMMUNITY): Payer: Self-pay | Admitting: Internal Medicine

## 2023-12-24 DIAGNOSIS — E8729 Other acidosis: Secondary | ICD-10-CM

## 2023-12-24 DIAGNOSIS — I4711 Inappropriate sinus tachycardia, so stated: Secondary | ICD-10-CM

## 2023-12-24 DIAGNOSIS — I5020 Unspecified systolic (congestive) heart failure: Secondary | ICD-10-CM

## 2023-12-24 DIAGNOSIS — R000 Tachycardia, unspecified: Secondary | ICD-10-CM

## 2023-12-24 DIAGNOSIS — K852 Alcohol induced acute pancreatitis without necrosis or infection: Principal | ICD-10-CM

## 2023-12-24 DIAGNOSIS — T730XXA Starvation, initial encounter: Secondary | ICD-10-CM

## 2023-12-24 DIAGNOSIS — K859 Acute pancreatitis without necrosis or infection, unspecified: Secondary | ICD-10-CM | POA: Diagnosis not present

## 2023-12-24 LAB — CBC WITH DIFFERENTIAL/PLATELET
Abs Immature Granulocytes: 0.02 K/uL (ref 0.00–0.07)
Basophils Absolute: 0 K/uL (ref 0.0–0.1)
Basophils Relative: 0 %
Eosinophils Absolute: 0 K/uL (ref 0.0–0.5)
Eosinophils Relative: 1 %
HCT: 30.3 % — ABNORMAL LOW (ref 39.0–52.0)
Hemoglobin: 10.7 g/dL — ABNORMAL LOW (ref 13.0–17.0)
Immature Granulocytes: 0 %
Lymphocytes Relative: 20 %
Lymphs Abs: 1 K/uL (ref 0.7–4.0)
MCH: 35.5 pg — ABNORMAL HIGH (ref 26.0–34.0)
MCHC: 35.3 g/dL (ref 30.0–36.0)
MCV: 100.7 fL — ABNORMAL HIGH (ref 80.0–100.0)
Monocytes Absolute: 0.7 K/uL (ref 0.1–1.0)
Monocytes Relative: 13 %
Neutro Abs: 3.2 K/uL (ref 1.7–7.7)
Neutrophils Relative %: 66 %
Platelets: 161 K/uL (ref 150–400)
RBC: 3.01 MIL/uL — ABNORMAL LOW (ref 4.22–5.81)
RDW: 13.8 % (ref 11.5–15.5)
WBC: 4.9 K/uL (ref 4.0–10.5)
nRBC: 0 % (ref 0.0–0.2)

## 2023-12-24 LAB — COMPREHENSIVE METABOLIC PANEL WITH GFR
ALT: 56 U/L — ABNORMAL HIGH (ref 0–44)
AST: 89 U/L — ABNORMAL HIGH (ref 15–41)
Albumin: 2.7 g/dL — ABNORMAL LOW (ref 3.5–5.0)
Alkaline Phosphatase: 96 U/L (ref 38–126)
Anion gap: 9 (ref 5–15)
BUN: 7 mg/dL (ref 6–20)
CO2: 24 mmol/L (ref 22–32)
Calcium: 9 mg/dL (ref 8.9–10.3)
Chloride: 99 mmol/L (ref 98–111)
Creatinine, Ser: 0.86 mg/dL (ref 0.61–1.24)
GFR, Estimated: >60 mL/min (ref >60)
Glucose, Bld: 94 mg/dL (ref 70–99)
Potassium: 3.6 mmol/L (ref 3.5–5.1)
Sodium: 132 mmol/L — ABNORMAL LOW (ref 135–145)
Total Bilirubin: 1.5 mg/dL — ABNORMAL HIGH (ref 0.0–1.2)
Total Protein: 6.9 g/dL (ref 6.5–8.1)

## 2023-12-24 LAB — GLUCOSE, CAPILLARY
Glucose-Capillary: 100 mg/dL — ABNORMAL HIGH (ref 70–99)
Glucose-Capillary: 189 mg/dL — ABNORMAL HIGH (ref 70–99)
Glucose-Capillary: 103 mg/dL — ABNORMAL HIGH (ref 70–99)

## 2023-12-24 LAB — TSH: TSH: 5.318 u[IU]/mL — ABNORMAL HIGH (ref 0.350–4.500)

## 2023-12-24 MED ORDER — METOPROLOL SUCCINATE ER 50 MG PO TB24
50.0000 mg | ORAL_TABLET | Freq: Every day | ORAL | 0 refills | Status: DC
  Filled 2023-12-24: qty 30, 30d supply, fill #0

## 2023-12-24 MED ORDER — METOPROLOL SUCCINATE ER 25 MG PO TB24
25.0000 mg | ORAL_TABLET | Freq: Every day | ORAL | 0 refills | Status: DC
  Filled 2023-12-24: qty 30, 30d supply, fill #0

## 2023-12-24 MED ORDER — METOPROLOL SUCCINATE ER 50 MG PO TB24
50.0000 mg | ORAL_TABLET | Freq: Every day | ORAL | Status: DC
Start: 2023-12-25 — End: 2023-12-24

## 2023-12-24 MED ORDER — METOPROLOL TARTRATE 12.5 MG HALF TABLET
12.5000 mg | ORAL_TABLET | Freq: Two times a day (BID) | ORAL | Status: DC
  Administered 2023-12-24: 12.5 mg via ORAL
  Filled 2023-12-24: qty 1

## 2023-12-24 MED ORDER — EMPAGLIFLOZIN 10 MG PO TABS
10.0000 mg | ORAL_TABLET | Freq: Every day | ORAL | 0 refills | Status: DC
  Filled 2023-12-24: qty 30, 30d supply, fill #0

## 2023-12-24 NOTE — Hospital Course (Signed)
 53 y.o. male with PMH significant for chronic alcoholism, smoking, h/o alcoholic pancreatitis chronic anemia. 9/27, patient presented to ED at [facility] with complaint of abdominal pain radiating to the back, nausea, vomiting, inability to oral intake for more than 24 hours. Drinks about 8 beers per day, last drink was on 9/26.   In the ED, patient was afebrile, tachycardic to 100s, blood pressure 155/105, breathing on room air Initial labs reviewed WBC count hemoglobin 12.2, sodium 130, serum bicarb 16, renal function normal, lipase elevated to 1484, AST/ALT/alk phos also elevated, total bilirubin normal, troponin negative. Blood alcohol level and salicylate level were not elevated UDS positive for THC CT angio chest/abdomen/pelvis showed acute uncomplicated pancreatitis without fluid collection, pseudocyst or abscess. Patient was started on conservative management with IV fluid, IV pain meds Admitted to [facility]

## 2023-12-24 NOTE — Discharge Summary (Signed)
 Physician Discharge Summary   Patient: Jamie Campos MRN: [id number] DOB: [birth year]  Admit date:     12/20/2023  Discharge date: 12/24/23  Discharge Physician: Garnette Pelt   PCP: Arloa Elsie SAUNDERS, MD   Recommendations at discharge:    Follow up with PCP In 1-2 weeks Follow up with Cardiology as scheduled  Discharge Diagnoses: Principal Problem:   Acute recurrent pancreatitis Active Problems:   Abnormal LFTs   Alcohol use disorder   HFrEF (heart failure with reduced ejection fraction) (HCC)   Inappropriate sinus tachycardia  Resolved Problems:   * No resolved hospital problems. *  Hospital Course: 53 y.o. male with PMH significant for chronic alcoholism, smoking, h/o alcoholic pancreatitis chronic anemia. 9/27, patient presented to ED at [facility] with complaint of abdominal pain radiating to the back, nausea, vomiting, inability to oral intake for more than 24 hours. Drinks about 8 beers per day, last drink was on 9/26.   In the ED, patient was afebrile, tachycardic to 100s, blood pressure 155/105, breathing on room air Initial labs reviewed WBC count hemoglobin 12.2, sodium 130, serum bicarb 16, renal function normal, lipase elevated to 1484, AST/ALT/alk phos also elevated, total bilirubin normal, troponin negative. Blood alcohol level and salicylate level were not elevated UDS positive for THC CT angio chest/abdomen/pelvis showed acute uncomplicated pancreatitis without fluid collection, pseudocyst or abscess. Patient was started on conservative management with IV fluid, IV pain meds Admitted to [facility]  Assessment and Plan: Acute alcoholic pancreatitis Patient with abdomen, nausea, vomiting in the setting of chronic alcohol use Lipase level was elevated.  CT with evidence of uncomplicated pancreatitis Currently on conservative management with IV fluid, IV analgesics, IV antiemetics, IV Protonix Abdomen pain improving.  No vomiting.  Successfully advanced diet   Elevated  liver enzymes Hepatic steatosis Likely d/t chronic alcoholism. AHP nonreactive. LFT trend, improving.   Chronic alcoholism At risk of alcohol withdrawal Drinks about 8 beers per day, last drink was on 9/26.SABRA Counseled to quit. No evidence of withdrawals this visit   Persistent sinus tachycardia Heart rate has been persistently elevated over 120 since admission.  Most likely 2/2 to acute pancreatitis.  No other evidence of alcohol withdrawal. Addendum 4:40 PM : echocardiogram showed LV global hypokinesis with EF 35 to 40%.  No prior ischemic eval.  Cardiology consulted per below  -Prescribed succinate 50mg     Hypokalemia    Hyponatremia    Chronic macrocytic anemia Secondary to alcoholism.  Drop in hemoglobin likely due to dilution from aggressive hydration.  No active bleeding  New systolic CHF New EF of 35% per above -likely related to ETOH abuse -Seen by Cardiology. Prescribed succinate 50mg  and Jardiaance on d/c   Consultants: Cardiology Procedures performed:   Disposition: Home Diet recommendation:  Cardiac diet DISCHARGE MEDICATION: Allergies as of 12/24/2023   No Known Allergies      Medication List     TAKE these medications    ibuprofen  200 MG tablet Commonly known as: ADVIL  Take 400 mg by mouth every 6 (six) hours as needed for moderate pain (pain score 4-6).   Jardiance 10 MG Tabs tablet Generic drug: empagliflozin Take 1 tablet (10 mg total) by mouth daily before breakfast.   metoprolol  succinate 50 MG 24 hr tablet Commonly known as: TOPROL -XL Take 1 tablet (50 mg total) by mouth daily. Take with or immediately following a meal. Start taking on: December 25, 2023   omeprazole  40 MG capsule Commonly known as: PRILOSEC Take 1 capsule (40mg ) by  mouth one-half to 1 hour before morning meal once a day.        Follow-up Information     [facility]. Go in 11 day(s).   Specialty: Cardiology Why: Hospital  follow up 01/05/2024 @ 9:45 am PLEASE bring a current medication list to appointment FREE valet parking, Entrance C, off [address] [facility] entrance Contact information: [address] [place]  [address] [phone]        Arloa Elsie SAUNDERS, MD Follow up in 2 week(s).   Specialty: Family Medicine Why: Hospital follow up Contact information: [address] [place] [address] [phone]                Discharge Exam: Jamie Campos   12/20/23 1715  Weight: 81.6 kg   General exam: Awake, laying in bed, in nad Respiratory system: Normal respiratory effort, no wheezing Cardiovascular system: regular rate, s1, s2 Gastrointestinal system: Soft, nondistended, positive BS Central nervous system: CN2-12 grossly intact, strength intact Extremities: Perfused, no clubbing Skin: Normal skin turgor, no notable skin lesions seen Psychiatry: Mood normal // no visual hallucinations   Condition at discharge: fair  The results of significant diagnostics from this hospitalization (including imaging, microbiology, ancillary and laboratory) are listed below for reference.   Imaging Studies: ECHOCARDIOGRAM COMPLETE Result Date: 12/23/2023    ECHOCARDIOGRAM REPORT   Patient Name:   Jamie Campos [facility] Date of Exam: 12/23/2023 Medical Rec #:  [id number]        Height:       69.0 in Accession #:    [id number]       Weight:       180.0 lb Date of Birth:  August 25, 1970       BSA:          1.976 m Patient Age:    52 years         BP:           120/103 mmHg Patient Gender: M                HR:           100 bpm. Exam Location:  Inpatient Procedure: 2D Echo, Cardiac Doppler, Color Doppler and Intracardiac            Opacification Agent (Both Spectral and Color Flow Doppler were            utilized during procedure). Indications:    Abnormal ECG  History:        Patient has no prior history of Echocardiogram examinations.                  Abnormal ECG, Arrythmias:Tachycardia; Risk Factors:Current                 Smoker.  Sonographer:    Koleen Popper RDCS Referring Phys: [id number] BINAYA DAHAL IMPRESSIONS  1. Left ventricular ejection fraction, by estimation, is 35 to 40%. The left ventricle has moderately decreased function. The left ventricle demonstrates regional wall motion abnormalities (see scoring diagram/findings for description). There is mild left ventricular hypertrophy. Left ventricular diastolic function could not be evaluated.  2. Right ventricular systolic function is normal. The right ventricular size is normal.  3. The mitral valve is normal in structure. No evidence of mitral valve regurgitation. Moderate mitral annular calcification.  4. The aortic valve is normal in structure. Aortic valve regurgitation is not visualized. Comparison(s): No  prior Echocardiogram. FINDINGS  Left Ventricle: Left ventricular ejection fraction, by estimation, is 35 to 40%. The left ventricle has moderately decreased function. The left ventricle demonstrates regional wall motion abnormalities. Definity contrast agent was given IV to delineate the left ventricular endocardial borders. The left ventricular internal cavity size was normal in size. There is mild left ventricular hypertrophy. Left ventricular diastolic function could not be evaluated due to mitral annular calcification (moderate or greater). Left ventricular diastolic function could not be evaluated.  LV Wall Scoring: The entire anterior wall and anterior septum are hypokinetic. Hypokinesis of the basal to apical anterior and anteroseptal segments. Right Ventricle: The right ventricular size is normal. No increase in right ventricular wall thickness. Right ventricular systolic function is normal. Left Atrium: Left atrial size was normal in size. Right Atrium: Right atrial size was normal in size. Pericardium: There is no evidence of pericardial effusion. Mitral Valve: The mitral valve is  normal in structure. Moderate mitral annular calcification. No evidence of mitral valve regurgitation. Tricuspid Valve: The tricuspid valve is normal in structure. Tricuspid valve regurgitation is trivial. Aortic Valve: The aortic valve is normal in structure. Aortic valve regurgitation is not visualized. Pulmonic Valve: The pulmonic valve was normal in structure. Pulmonic valve regurgitation is trivial. Aorta: The aortic root and ascending aorta are structurally normal, with no evidence of dilitation. IAS/Shunts: The interatrial septum was not well visualized.  LEFT VENTRICLE PLAX 2D LVIDd:         4.94 cm   Diastology LVIDs:         4.00 cm   LV e' lateral: 5.77 cm/s LV PW:         1.16 cm LV IVS:        1.16 cm LVOT diam:     2.25 cm LV SV:         46 LV SV Index:   23 LVOT Area:     3.98 cm  RIGHT VENTRICLE             IVC RV Basal diam:  3.72 cm     IVC diam: 1.32 cm RV S prime:     12.60 cm/s LEFT ATRIUM           Index        RIGHT ATRIUM           Index LA diam:      3.54 cm 1.79 cm/m   RA Area:     11.20 cm LA Vol (A2C): 27.6 ml 13.97 ml/m  RA Volume:   22.80 ml  11.54 ml/m LA Vol (A4C): 27.6 ml 13.97 ml/m  AORTIC VALVE LVOT Vmax:   87.50 cm/s LVOT Vmean:  57.100 cm/s LVOT VTI:    0.115 m  AORTA Ao Root diam: 3.05 cm Ao Asc diam:  3.01 cm  SHUNTS Systemic VTI:  0.12 m Systemic Diam: 2.25 cm Jamie Campos Electronically signed by Jamie Campos Signature Date/Time: 12/23/2023/4:30:02 PM    Final    US  Abdomen Limited RUQ (LIVER/GB) Result Date: 12/21/2023 CLINICAL DATA:  Elevated LFTs. EXAM: ULTRASOUND ABDOMEN LIMITED RIGHT UPPER QUADRANT COMPARISON:  12/20/2023, 07/16/2022. FINDINGS: Gallbladder: No gallstones or wall thickening visualized. No sonographic Murphy sign noted by sonographer. Common bile duct: Diameter: 6.4 mm Liver: No focal lesion identified. Increased parenchymal echogenicity. Portal vein is patent on color Doppler imaging with normal direction of blood flow towards the liver. Other:  None. IMPRESSION: Hepatic steatosis. Electronically Signed   By: Leita Waddell HERO.D.  On: 12/21/2023 15:14   CT Angio Chest/Abd/Pel for Dissection W and/or Wo Contrast Result Date: 12/20/2023 CLINICAL DATA:  Short of breath, decreased appetite, right-sided chest pain, generalized abdominal pain EXAM: CT ANGIOGRAPHY CHEST, ABDOMEN AND PELVIS TECHNIQUE: Initial noncontrast CT of the chest was obtained. Multidetector CT imaging through the chest, abdomen and pelvis was performed using the standard protocol during bolus administration of intravenous contrast. Multiplanar reconstructed images and MIPs were obtained and reviewed to evaluate the vascular anatomy. RADIATION DOSE REDUCTION: This exam was performed according to the departmental dose-optimization program which includes automated exposure control, adjustment of the mA and/or kV according to patient size and/or use of iterative reconstruction technique. CONTRAST:  OMNIPAQUE  IOHEXOL  350 MG/ML SOLN COMPARISON:  08/06/2022 FINDINGS: CTA CHEST FINDINGS Cardiovascular: No evidence of thoracic aortic aneurysm or dissection. Atherosclerosis of the aortic arch and descending thoracic aorta. There is technically adequate opacification of the pulmonary vasculature. No filling defects or pulmonary emboli. The heart is unremarkable without pericardial effusion. Mediastinum/Nodes: No enlarged mediastinal, hilar, or axillary lymph nodes. Thyroid gland, trachea, and esophagus demonstrate no significant findings. Lungs/Pleura: No acute airspace disease, effusion, or pneumothorax. Central airways are patent. 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 No acute displaced fractures. Reconstructed images demonstrate no additional findings. Review of the MIP images confirms the above findings. CTA ABDOMEN AND PELVIS FINDINGS VASCULAR Aorta: Normal caliber aorta without aneurysm, dissection, vasculitis or significant stenosis.  Diffuse atherosclerosis. Celiac: Patent without evidence of aneurysm, dissection, vasculitis or significant stenosis. SMA: Patent without evidence of aneurysm, dissection, vasculitis or significant stenosis. Renals: Both renal arteries are patent without evidence of aneurysm, dissection, vasculitis, fibromuscular dysplasia or significant stenosis. IMA: Patent without evidence of aneurysm, dissection, vasculitis or significant stenosis. Inflow: Stable irregular atheromatous plaque within the right common iliac artery, with estimated 50% stenosis unchanged. No other significant stenosis, aneurysm, dissection, or vasculitis. Veins: No obvious venous abnormality within the limitations of this arterial phase study. Review of the MIP images confirms the above findings. NON-VASCULAR Hepatobiliary: Diffuse hepatic steatosis. No focal liver abnormality. The gallbladder is unremarkable. Pancreas: Prominent edema within the pancreatic head, with significant peripancreatic fat stranding, consistent with acute uncomplicated pancreatitis. No fluid collection, pseudocyst, or abscess. No pancreatic duct dilation. Spleen: Normal in size without focal abnormality. Adrenals/Urinary Tract: Adrenal glands are unremarkable. Kidneys are normal, without renal calculi, focal lesion, or hydronephrosis. Bladder is unremarkable. Stomach/Bowel: No bowel obstruction or ileus. Normal appendix right lower quadrant. Scattered sigmoid diverticulosis without diverticulitis. No bowel wall thickening. Lymphatic: No pathologic adenopathy. Reproductive: Prostate is unremarkable. Other: No free fluid or free intraperitoneal gas. No abdominal wall hernia. Musculoskeletal: No acute or destructive bony abnormalities. Reconstructed images demonstrate no additional findings. Review of the MIP images confirms the above findings. IMPRESSION: Vascular: 1. No evidence of thoracoabdominal aortic aneurysm or dissection. 2. No evidence of pulmonary embolus. 3.   Aortic Atherosclerosis (ICD10-I70.0). 4. 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 Nonvascular: 1. Acute uncomplicated pancreatitis. No fluid collection, pseudocyst, or abscess. 2. Hepatic steatosis. 3. Sigmoid diverticulosis without diverticulitis. Electronically Signed   By: Ozell Daring M.D.   On: 12/20/2023 19:28    Microbiology: Results for orders placed or performed during the hospital encounter of 12/20/23  Urine Culture     Status: None   Collection Time: 12/20/23  6:44 PM   Specimen: Urine, Clean Catch  Result Value Ref Range Status   Specimen Description   Final    URINE, CLEAN  CATCH Performed at [facility], [address] [place], [place] [address]    Special Requests   Final    NONE Performed at [facility], [address] [place], [place] [address]    Culture   Final    NO GROWTH Performed at [facility], [address] [place], [place] [address]    Report Status 12/21/2023 FINAL  Final    Labs: CBC: Recent Labs  Lab 12/20/23 1831 12/20/23 1950 12/22/23 0539 12/23/23 0433 12/24/23 0501  WBC 6.0  --  6.8 5.6 4.9  NEUTROABS 5.0  --   --  4.2 3.2  HGB 12.2* 12.6* 12.9* 10.2* 10.7*  HCT 34.3* 37.0* 36.4* 28.8* 30.3*  MCV 100.3*  --  101.4* 99.3 100.7*  PLT 101*  --  114* 120* 161   Basic Metabolic Panel: Recent Labs  Lab 12/20/23 1753 12/20/23 1950 12/21/23 1522 12/22/23 0539 12/23/23 0433 12/24/23 0501  NA 130* 128* 129* 128* 131* 132*  K 3.8 3.8 3.3* 3.7 3.1* 3.6  CL 91*  --  96* 99 97* 99  CO2 16*  --  18* 19* 22 24  GLUCOSE 126*  --  128* 100* 86 94  BUN 8  --  7 5* 5* 7  CREATININE 1.19  --  1.08 0.95 0.83 0.86  CALCIUM 10.2  --  9.7 9.4 8.8* 9.0  MG  --   --  1.7  --   --   --    Liver Function Tests: Recent Labs  Lab 12/20/23 1753 12/21/23 1522 12/22/23 0539 12/23/23 0433 12/24/23 0501  AST 105* 54* 56* 49* 89*  ALT 56* 39 35 33 56*  ALKPHOS 163* 104 99 87 96   BILITOT 1.2 1.7* 1.6* 1.5* 1.5*  PROT 9.7* 8.5* 7.6 5.2* 6.9  ALBUMIN 4.9 3.5 2.9* 2.5* 2.7*   CBG: Recent Labs  Lab 12/23/23 0802 12/23/23 2128 12/24/23 0816 12/24/23 1145 12/24/23 1557  GLUCAP 89 114* 100* 189* 103*    Discharge time spent: less than 30 minutes.  Signed: Garnette Pelt, MD [facility] 12/24/2023

## 2023-12-24 NOTE — Progress Notes (Signed)
 TSH lab collected prior to patient d/c.

## 2023-12-24 NOTE — Plan of Care (Signed)

## 2023-12-24 NOTE — Progress Notes (Signed)
 Heart Failure Nurse Navigator Progress Note  PCP: Arloa Elsie SAUNDERS, MD PCP-Cardiologist: None Admission Diagnosis: Alcohol induced acute pancreatitis, starvation ketoacidosis.  Admitted from: Home  Presentation:   Jamie Campos presented with shortness of breath, decreased appetite, vomiting, back pain and right sided chest pain. Patient reports to drinking about 6 beers a day. Does work as a Naval architect, BP 880/00, HR 894, Lactic acid 2.4, Lipase 1,484, UDS positive for Tetrahydrocannabinol. During this admission the patient's HR was persistently elevated, rates 100-120s. EKG on 9/27 showed sinus tachycardia with HR 108. It appears he has been in sinus tach the entire admission. An echocardiogram was ordered this admission which showed: LVEF 35-40%, mild LVH, RWMA with hypokinesis of anterior wall and septum, normal RV systolic function, no significant valvular abnormalities. Initial troponin on presentation was negative. EKG without acute ischemic changes.   Patient and his mom where educated on the sign and symptoms of heart failure, daily weights. When to call his doctor or go to the ED. 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 Continued education on taking all medications as prescribed and attending all medical appointments. Patient verbalized his understanding. A HF TOC appointment was scheduled for 01/05/2024 @ 9:45 am.   ECHO/ LVEF: 35-40% new reduced   Clinical Course:  Past Medical History:  Diagnosis Date   Acute pancreatitis    Alcohol use disorder    Anemia    PNA (pneumonia)    Rib fractures      Social History   Socioeconomic History   Marital status: Married    Spouse name: Not on file   Number of children: Not on file   Years of education: Not on file   Highest education level: Not on file  Occupational History   Not on file  Tobacco Use   Smoking status: Every Day    Current  packs/day: 1.00    Types: Cigarettes   Smokeless tobacco: Never  Vaping Use   Vaping status: Never Used  Substance and Sexual Activity   Alcohol use: Yes    Alcohol/week: 3.0 standard drinks of alcohol    Types: 3 Cans of beer per week   Drug use: No   Sexual activity: Not on file  Other Topics Concern   Not on file  Social History Narrative   Not on file   Social Drivers of Health   Financial Resource Strain: Not on file  Food Insecurity: Patient Declined (12/21/2023)   Hunger Vital Sign    Worried About Running Out of Food in the Last Year: Patient declined    Ran Out of Food in the Last Year: Patient declined  Transportation Needs: Patient Declined (12/21/2023)   PRAPARE - Administrator, Civil Service (Medical): Patient declined    Lack of Transportation (Non-Medical): Patient declined  Physical Activity: Not on file  Stress: Not on file  Social Connections: Patient Declined (12/21/2023)   Social Connection and Isolation Panel    Frequency of Communication with Friends and Family: Patient declined    Frequency of Social Gatherings with Friends and Family: Patient declined    Attends Religious Services: Patient declined    Database administrator or Organizations: Patient declined    Attends Banker Meetings: Patient declined    Marital Status: Patient declined   Education Assessment and Provision:  Detailed education and instructions provided on heart failure disease management  including the following:  Signs and symptoms of Heart Failure When to call the physician Importance of daily weights Low sodium diet Fluid restriction Medication management Anticipated future follow-up appointments  Patient education given on each of the above topics.  Patient acknowledges understanding via teach back method and acceptance of all instructions.  Education Materials:  Living Better With Heart Failure Booklet, HF zone tool, & Daily Weight Tracker  Tool.  Patient has scale at home: Yes Patient has pill box at home: NA    High Risk Criteria for Readmission and/or Poor Patient Outcomes: Heart failure hospital admissions (last 6 months): 0  No Show rate: 9 % Difficult social situation: No, lives with his Mom Demonstrates medication adherence: Yes Primary Language: English Literacy level: Reading, writing, and comprehension  Barriers of Care:   Diet/ fluid restrictions ( salty foods/ soda)  Daily weights Alcohol daily consumption  Smoking cessation   Considerations/Referrals:   Referral made to Heart Failure Pharmacist Stewardship: NA Referral made to Heart Failure CSW/NCM TOC: NA Referral made to Heart & Vascular TOC clinic: Yes, 01/05/2024 @ 9:45 am  Items for Follow-up on DC/TOC: Continued HF education Diet/ fluid restrictions/ daily weights Substance cessation ( alcohol) and smoking    Stephane Haddock, BSN, Scientist, clinical (histocompatibility and immunogenetics) Only

## 2023-12-24 NOTE — Consult Note (Signed)
 Cardiology Consultation  Patient ID: Jamie Campos MRN: [id number]; DOB: Apr 13, 1970  Admit date: 12/20/2023 Date of Consult: 12/24/2023  PCP:  Jamie Elsie SAUNDERS, MD   [facility] Providers Cardiologist:  New  Patient Profile: Jamie Campos is a 53 y.o. male with a hx of alcohol use disorder, previous history of pancreatitis, tobacco use disorder who is being seen 12/24/2023 for the evaluation of new reduced EF at the request of Dr. Arlice.  History of Present Illness: Jamie Campos has past medical history as listed above. He presented to ED at [facility] on 12/20/2023 for shortness of breath, abdominal pain, vomiting. Workup while in the ED lead to the diagnosis of acute alcohol induced pancreatitis and being transferred to [facility].   He was placed on the medicine service and received appropriate treatment; IV fluids, IV analgesics, IV antiemetics, IV Protonix. His abdominal pain was improving and he was able to tolerate a liquid diet. His lipase level improved from 1,484 ? 557. He was maintained on CIWA protocol, electrolytes were supplemented as needed.   During this admission the patient's HR was persistently elevated, rates 100-120s. EKG on 9/27 showed sinus tachycardia with HR 108. It appears he has been in sinus tach the entire admission. An echocardiogram was ordered this admission which showed: LVEF 35-40%, mild LVH, RWMA with hypokinesis of anterior wall and septum, normal RV systolic function, no significant valvular abnormalities. Initial troponin on presentation was negative.   Patient has no prior echocardiograms to compare, no known history of any cardiac conditions and has not seen a cardiologist prior.   After speaking with the patient, he agrees to the history stated above.  He told me that on presentation he had more shortness of breath associated with his abdominal pain and vomiting.  He denies any past or current chest pain. He tells me  that he has not had any exertional symptoms recently, but he does admit that he is not frequently exerting himself. He denies any personal or family history of known heart disease. I explained the results of his echocardiogram with him and answered all of this questions.   Past Medical History:  Diagnosis Date   Acute pancreatitis    Alcohol use disorder    Anemia    PNA (pneumonia)    Rib fractures    Past Surgical History:  Procedure Laterality Date   ROTATOR CUFF REPAIR      Home Medications:  Prior to Admission medications   Medication Sig Start Date End Date Taking? Authorizing Provider  ibuprofen  (ADVIL ) 200 MG tablet Take 400 mg by mouth every 6 (six) hours as needed for moderate pain (pain score 4-6).   Yes [provider]  omeprazole  (PRILOSEC) 40 MG capsule Take 1 capsule (40mg ) by mouth one-half to 1 hour before morning meal once a day. Patient not taking: Reported on 12/22/2023 11/28/23      Scheduled Meds:  enoxaparin  (LOVENOX ) injection  40 mg Subcutaneous Q24H   feeding supplement  237 mL Oral BID BM   folic acid   1 mg Oral Daily   multivitamin with minerals  1 tablet Oral Daily   nicotine   14 mg Transdermal Daily   thiamine   100 mg Oral Daily   Or   thiamine   100 mg Intravenous Daily   Continuous Infusions:  PRN Meds:  Allergies:   No Known Allergies  Social History:   Social History   Socioeconomic History   Marital status: Married  Spouse name: Jamie Campos   Number of children: 1   Years of education: Not on file   Highest education level: High school graduate  Occupational History   Occupation: Drives a truck  Tobacco Use   Smoking status: Every Day    Current packs/day: 1.00    Average packs/day: 1 pack/day for 30.0 years (30.0 ttl pk-yrs)    Types: Cigarettes    Start date: 12/23/1993   Smokeless tobacco: Never  Vaping Use   Vaping status: Never Used  Substance and Sexual Activity   Alcohol use: Yes    Alcohol/week: 3.0 standard  drinks of alcohol    Types: 3 Cans of beer per week    Comment: 6 per day   Drug use: Not Currently   Sexual activity: Not on file  Other Topics Concern   Not on file  Social History Narrative   Not on file   Social Drivers of Health   Financial Resource Strain: Not on file  Food Insecurity: Patient Declined (12/21/2023)   Hunger Vital Sign    Worried About Running Out of Food in the Last Year: Patient declined    Ran Out of Food in the Last Year: Patient declined  Transportation Needs: No Transportation Needs (12/24/2023)   PRAPARE - Administrator, Civil Service (Medical): No    Lack of Transportation (Non-Medical): No  Physical Activity: Not on file  Stress: Not on file  Social Connections: Patient Declined (12/21/2023)   Social Connection and Isolation Panel    Frequency of Communication with Friends and Family: Patient declined    Frequency of Social Gatherings with Friends and Family: Patient declined    Attends Religious Services: Patient declined    Database administrator or Organizations: Patient declined    Attends Banker Meetings: Patient declined    Marital Status: Patient declined  Intimate Partner Violence: Patient Declined (12/21/2023)   Humiliation, Afraid, Rape, and Kick questionnaire    Fear of Current or Ex-Partner: Patient declined    Emotionally Abused: Patient declined    Physically Abused: Patient declined    Sexually Abused: Patient declined    Family History:   History reviewed. No pertinent family history.   ROS:  Please see the history of present illness.   All other ROS reviewed and negative.     Physical Exam/Data: Vitals:   12/23/23 1148 12/23/23 1900 12/24/23 0500 12/24/23 0817  BP: 119/79 122/75 119/72 126/80  Pulse: (!) 110 (!) 114 (!) 107 100  Resp: 16 17 18 20   Temp: 98.6 F (37 C) 98.5 F (36.9 C) 98 F (36.7 C) 98.6 F (37 C)  TempSrc: Oral Oral Oral Oral  SpO2: 98% 99% 100% 100%  Weight:       Height:        Intake/Output Summary (Last 24 hours) at 12/24/2023 1138 Last data filed at 12/24/2023 0950 Gross per 24 hour  Intake 771.67 ml  Output --  Net 771.67 ml      12/20/2023    5:15 PM 09/10/2022    2:00 PM 08/06/2022   10:23 PM  Last 3 Weights  Weight (lbs) 180 lb 178 lb 179 lb 4.8 oz  Weight (kg) 81.647 kg 80.74 kg 81.33 kg   Body mass index is 26.58 kg/m.   General:  in no acute distress, on room air  HEENT: normal Neck: no JVD Vascular:  Distal pulses 2+ bilaterally Cardiac:  tachycardic; no murmur  Lungs:  clear to auscultation  bilaterally Abd: soft, nondistended Ext: no edema Musculoskeletal:  No deformities Skin: warm and dry  Neuro:  no focal abnormalities noted Psych:  Normal affect   EKG:  The EKG was personally reviewed and demonstrates:  sinus tachycardia, HR 108  Telemetry:  Telemetry was personally reviewed and demonstrates:  sinus tachycardia, HR 100-110s but does get as high as 140s  Relevant CV Studies:  Echocardiogram, 12/23/2023 Left ventricular ejection fraction, by estimation, is 35 to 40% . The left ventricle has moderately decreased function. The left ventricle demonstrates regional wall motion abnormalities ( see scoring diagram/ findings for description) . There is mild left ventricular hypertrophy. Left ventricular diastolic function could not be evaluated.  Right ventricular systolic function is normal. The right ventricular size is normal.  The mitral valve is normal in structure. No evidence of mitral valve regurgitation. Moderate mitral annular calcification.  The aortic valve is normal in structure. Aortic valve regurgitation is not visualized.  Laboratory Data: High Sensitivity Troponin:  No results for input(s): TROPONINIHS in the last 720 hours.   Chemistry Recent Labs  Lab 12/21/23 1522 12/22/23 0539 12/23/23 0433 12/24/23 0501  NA 129* 128* 131* 132*  K 3.3* 3.7 3.1* 3.6  CL 96* 99 97* 99  CO2 18* 19* 22 24   GLUCOSE 128* 100* 86 94  BUN 7 5* 5* 7  CREATININE 1.08 0.95 0.83 0.86  CALCIUM 9.7 9.4 8.8* 9.0  MG 1.7  --   --   --   GFRNONAA >60 >60 >60 >60  ANIONGAP 15 10 12 9     Recent Labs  Lab 12/22/23 0539 12/23/23 0433 12/24/23 0501  PROT 7.6 5.2* 6.9  ALBUMIN 2.9* 2.5* 2.7*  AST 56* 49* 89*  ALT 35 33 56*  ALKPHOS 99 87 96  BILITOT 1.6* 1.5* 1.5*   Lipids  Recent Labs  Lab 12/21/23 1522  CHOL 181  TRIG 62  HDL 41  LDLCALC 128*  CHOLHDL 4.4    Hematology Recent Labs  Lab 12/22/23 0539 12/23/23 0433 12/24/23 0501  WBC 6.8 5.6 4.9  RBC 3.59* 2.90* 3.01*  HGB 12.9* 10.2* 10.7*  HCT 36.4* 28.8* 30.3*  MCV 101.4* 99.3 100.7*  MCH 35.9* 35.2* 35.5*  MCHC 35.4 35.4 35.3  RDW 13.9 13.8 13.8  PLT 114* 120* 161   Thyroid No results for input(s): TSH, FREET4 in the last 168 hours.  BNPNo results for input(s): BNP, PROBNP in the last 168 hours.  DDimer No results for input(s): DDIMER in the last 168 hours.  Radiology/Studies:  ECHOCARDIOGRAM COMPLETE Result Date: 12/23/2023    ECHOCARDIOGRAM REPORT   Patient Name:   KEKOA FYOCK [facility] Date of Exam: 12/23/2023 Medical Rec #:  [id number]        Height:       69.0 in Accession #:    [id number]       Weight:       180.0 lb Date of Birth:  December 06, 1970       BSA:          1.976 m Patient Age:    52 years         BP:           120/103 mmHg Patient Gender: M                HR:           100 bpm. Exam Location:  Inpatient Procedure: 2D Echo, Cardiac Doppler, Color Doppler and Intracardiac  Opacification Agent (Both Spectral and Color Flow Doppler were            utilized during procedure). Indications:    Abnormal ECG  History:        Patient has no prior history of Echocardiogram examinations.                 Abnormal ECG, Arrythmias:Tachycardia; Risk Factors:Current                 Smoker.  Sonographer:    Koleen Popper RDCS Referring Phys: [id number] BINAYA DAHAL IMPRESSIONS  1. Left ventricular ejection fraction, by  estimation, is 35 to 40%. The left ventricle has moderately decreased function. The left ventricle demonstrates regional wall motion abnormalities (see scoring diagram/findings for description). There is mild left ventricular hypertrophy. Left ventricular diastolic function could not be evaluated.  2. Right ventricular systolic function is normal. The right ventricular size is normal.  3. The mitral valve is normal in structure. No evidence of mitral valve regurgitation. Moderate mitral annular calcification.  4. The aortic valve is normal in structure. Aortic valve regurgitation is not visualized. Comparison(s): No prior Echocardiogram. FINDINGS  Left Ventricle: Left ventricular ejection fraction, by estimation, is 35 to 40%. The left ventricle has moderately decreased function. The left ventricle demonstrates regional wall motion abnormalities. Definity contrast agent was given IV to delineate the left ventricular endocardial borders. The left ventricular internal cavity size was normal in size. There is mild left ventricular hypertrophy. Left ventricular diastolic function could not be evaluated due to mitral annular calcification (moderate or greater). Left ventricular diastolic function could not be evaluated.  LV Wall Scoring: The entire anterior wall and anterior septum are hypokinetic. Hypokinesis of the basal to apical anterior and anteroseptal segments. Right Ventricle: The right ventricular size is normal. No increase in right ventricular wall thickness. Right ventricular systolic function is normal. Left Atrium: Left atrial size was normal in size. Right Atrium: Right atrial size was normal in size. Pericardium: There is no evidence of pericardial effusion. Mitral Valve: The mitral valve is normal in structure. Moderate mitral annular calcification. No evidence of mitral valve regurgitation. Tricuspid Valve: The tricuspid valve is normal in structure. Tricuspid valve regurgitation is trivial. Aortic  Valve: The aortic valve is normal in structure. Aortic valve regurgitation is not visualized. Pulmonic Valve: The pulmonic valve was normal in structure. Pulmonic valve regurgitation is trivial. Aorta: The aortic root and ascending aorta are structurally normal, with no evidence of dilitation. IAS/Shunts: The interatrial septum was not well visualized.  LEFT VENTRICLE PLAX 2D LVIDd:         4.94 cm   Diastology LVIDs:         4.00 cm   LV e' lateral: 5.77 cm/s LV PW:         1.16 cm LV IVS:        1.16 cm LVOT diam:     2.25 cm LV SV:         46 LV SV Index:   23 LVOT Area:     3.98 cm  RIGHT VENTRICLE             IVC RV Basal diam:  3.72 cm     IVC diam: 1.32 cm RV S prime:     12.60 cm/s LEFT ATRIUM           Index        RIGHT ATRIUM           Index  LA diam:      3.54 cm 1.79 cm/m   RA Area:     11.20 cm LA Vol (A2C): 27.6 ml 13.97 ml/m  RA Volume:   22.80 ml  11.54 ml/m LA Vol (A4C): 27.6 ml 13.97 ml/m  AORTIC VALVE LVOT Vmax:   87.50 cm/s LVOT Vmean:  57.100 cm/s LVOT VTI:    0.115 m  AORTA Ao Root diam: 3.05 cm Ao Asc diam:  3.01 cm  SHUNTS Systemic VTI:  0.12 m Systemic Diam: 2.25 cm Emeline Calender Electronically signed by Emeline Calender Signature Date/Time: 12/23/2023/4:30:02 PM    Final    US  Abdomen Limited RUQ (LIVER/GB) Result Date: 12/21/2023 CLINICAL DATA:  Elevated LFTs. EXAM: ULTRASOUND ABDOMEN LIMITED RIGHT UPPER QUADRANT COMPARISON:  12/20/2023, 07/16/2022. FINDINGS: Gallbladder: No gallstones or wall thickening visualized. No sonographic Murphy sign noted by sonographer. Common bile duct: Diameter: 6.4 mm Liver: No focal lesion identified. Increased parenchymal echogenicity. Portal vein is patent on color Doppler imaging with normal direction of blood flow towards the liver. Other: None. IMPRESSION: Hepatic steatosis. Electronically Signed   By: Leita Birmingham M.D.   On: 12/21/2023 15:14   CT Angio Chest/Abd/Pel for Dissection W and/or Wo Contrast Result Date: 12/20/2023 CLINICAL DATA:  Short  of breath, decreased appetite, right-sided chest pain, generalized abdominal pain EXAM: CT ANGIOGRAPHY CHEST, ABDOMEN AND PELVIS TECHNIQUE: Initial noncontrast CT of the chest was obtained. Multidetector CT imaging through the chest, abdomen and pelvis was performed using the standard protocol during bolus administration of intravenous contrast. Multiplanar reconstructed images and MIPs were obtained and reviewed to evaluate the vascular anatomy. RADIATION DOSE REDUCTION: This exam was performed according to the departmental dose-optimization program which includes automated exposure control, adjustment of the mA and/or kV according to patient size and/or use of iterative reconstruction technique. CONTRAST:  OMNIPAQUE  IOHEXOL  350 MG/ML SOLN COMPARISON:  08/06/2022 FINDINGS: CTA CHEST FINDINGS Cardiovascular: No evidence of thoracic aortic aneurysm or dissection. Atherosclerosis of the aortic arch and descending thoracic aorta. There is technically adequate opacification of the pulmonary vasculature. No filling defects or pulmonary emboli. The heart is unremarkable without pericardial effusion. Mediastinum/Nodes: No enlarged mediastinal, hilar, or axillary lymph nodes. Thyroid gland, trachea, and esophagus demonstrate no significant findings. Lungs/Pleura: No acute airspace disease, effusion, or pneumothorax. Central airways are patent. 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 No acute displaced fractures. Reconstructed images demonstrate no additional findings. Review of the MIP images confirms the above findings. CTA ABDOMEN AND PELVIS FINDINGS VASCULAR Aorta: Normal caliber aorta without aneurysm, dissection, vasculitis or significant stenosis. Diffuse atherosclerosis. Celiac: Patent without evidence of aneurysm, dissection, vasculitis or significant stenosis. SMA: Patent without evidence of aneurysm, dissection, vasculitis or significant stenosis. Renals:  Both renal arteries are patent without evidence of aneurysm, dissection, vasculitis, fibromuscular dysplasia or significant stenosis. IMA: Patent without evidence of aneurysm, dissection, vasculitis or significant stenosis. Inflow: Stable irregular atheromatous plaque within the right common iliac artery, with estimated 50% stenosis unchanged. No other significant stenosis, aneurysm, dissection, or vasculitis. Veins: No obvious venous abnormality within the limitations of this arterial phase study. Review of the MIP images confirms the above findings. NON-VASCULAR Hepatobiliary: Diffuse hepatic steatosis. No focal liver abnormality. The gallbladder is unremarkable. Pancreas: Prominent edema within the pancreatic head, with significant peripancreatic fat stranding, consistent with acute uncomplicated pancreatitis. No fluid collection, pseudocyst, or abscess. No pancreatic duct dilation. Spleen: Normal in size without focal abnormality. Adrenals/Urinary Tract: Adrenal glands are unremarkable. Kidneys are normal, without renal  calculi, focal lesion, or hydronephrosis. Bladder is unremarkable. Stomach/Bowel: No bowel obstruction or ileus. Normal appendix right lower quadrant. Scattered sigmoid diverticulosis without diverticulitis. No bowel wall thickening. Lymphatic: No pathologic adenopathy. Reproductive: Prostate is unremarkable. Other: No free fluid or free intraperitoneal gas. No abdominal wall hernia. Musculoskeletal: No acute or destructive bony abnormalities. Reconstructed images demonstrate no additional findings. Review of the MIP images confirms the above findings. IMPRESSION: Vascular: 1. No evidence of thoracoabdominal aortic aneurysm or dissection. 2. No evidence of pulmonary embolus. 3.  Aortic Atherosclerosis (ICD10-I70.0). 4. 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 Nonvascular: 1. Acute uncomplicated pancreatitis. No fluid collection, pseudocyst, or abscess. 2. Hepatic  steatosis. 3. Sigmoid diverticulosis without diverticulitis. Electronically Signed   By: Ozell Daring M.D.   On: 12/20/2023 19:28   Assessment and Plan:  Newly discovered cardiomyopathy  Echo this admission: LVEF 35-40%, mild LVH, normal RV systolic function Suspect alcohol induced, tachy-mediated  Denies any prior or active chest pain No orthopnea, LE edema Reports mild shortness of breath, experienced with his abdominal pain Troponin negative EKG without acute ischemic changes  Patient appears hemodynamically stable with no signs of alcohol withdrawal  Recommend starting GDMT while inpatient, start with low dose ARB and beta-blocker  Would benefit from repeat echo in ~3 months to reevaluate LVEF, if EF does not improve could consider ischemic evaluation, will discuss with MD  Sinus tachycardia Prior hospital notes all indicate HRs 100-140s Rates this admission: 100-110s but get as high as 140s Patient denies any palpitations, lightheadedness, dizziness, syncope Will benefit from beta-blocker as above   Per primary Acute alcoholic pancreatitis  Chronic alcoholism  CIWA protocol Elevated LFTs Hepatic steatosis Electrolyte disturbances Chronic anemia    For questions or updates, please contact [facility] Please consult [email or web address] for contact info under      Signed, Waddell DELENA Donath, PA-C  12/24/2023 11:38 AM

## 2023-12-25 ENCOUNTER — Other Ambulatory Visit (HOSPITAL_BASED_OUTPATIENT_CLINIC_OR_DEPARTMENT_OTHER): Payer: Self-pay

## 2023-12-25 MED ORDER — NICOTINE 21 MG/24HR TD PT24
21.0000 mg | MEDICATED_PATCH | Freq: Every day | TRANSDERMAL | 0 refills | Status: DC
  Filled 2023-12-25: qty 28, 28d supply, fill #0

## 2023-12-29 NOTE — Progress Notes (Signed)
 HEART & VASCULAR TRANSITION OF CARE CONSULT NOTE   Referring Physician: Dr. Stephany PCP: Arloa Elsie SAUNDERS, MD  Cardiologist: None  HPI: Referred to clinic by Dr. Cindy for heart failure consultation.   Jamie Campos is a 53 y.o. male with history of chronic alcoholism with pancreatitis, anemia, smoking, and now new diagnosis HFrEF.   Admitted to [facility] 12/20/23 with chief complaint of abdominal pain, nausea, and vomiting. He was tachycardic and hypertensive. CT angio C/A/P showed uncomplicated pancreatitis without fluic collection or abscess. Due to persistent tachycardia, likely secondary to acute pancreatitis, echo was performed (see below). Cardiology was consulted, he was started on metoprolol  for tachycardia with plans for repeat echo in 2-3 months.   Today he presents for transition of care visit with his sister. Overall feeling well. NYHA II. Reports one episode of dyspnea, while having a long conversation. Denies chest pain, fatigue, orthopnea, palpitations, and dizziness. Able to perform ADLs. Appetite okay. Weight at home stable, has log. Compliant with all medications. Has stopped drinking.   Cardiac Testing:  - Echo 9/25: EF 35-40%, RWMAs, nl RV function.  Past Medical History:  Diagnosis Date   Acute pancreatitis    Alcohol use disorder    Anemia    PNA (pneumonia)    Rib fractures     Current Outpatient Medications  Medication Sig Dispense Refill   atorvastatin (LIPITOR) 40 MG tablet Take 1 tablet (40 mg total) by mouth daily. 90 tablet 3   empagliflozin (JARDIANCE) 10 MG TABS tablet Take 1 tablet (10 mg total) by mouth daily before breakfast. 30 tablet 0   furosemide (LASIX) 20 MG tablet Take 1 tablet (20 mg total) by mouth daily as needed (FOR SWELLING OR WEIGHT GAIN OF 3 POUNDS OVERNIGHT OR 5 POUNDS IN 1 WEEK). 30 tablet 3   metoprolol  succinate (TOPROL -XL) 50 MG 24 hr tablet Take 1 tablet (50 mg total) by mouth daily. Take with or immediately following a meal. 30  tablet 0   nicotine  (NICODERM CQ ) 21 mg/24hr patch Place 1 patch (21 mg total) onto the skin daily. 28 patch 0   omeprazole  (PRILOSEC) 40 MG capsule Take 1 capsule (40mg ) by mouth one-half to 1 hour before morning meal once a day. 60 capsule 0   spironolactone (ALDACTONE) 25 MG tablet Take 1 tablet (25 mg total) by mouth daily. 30 tablet 5   No current facility-administered medications for this encounter.    No Known Allergies    Social History   Socioeconomic History   Marital status: Married    Spouse name: [name]   Number of children: 1   Years of education: Not on file   Highest education level: High school graduate  Occupational History   Occupation: Drives a truck  Tobacco Use   Smoking status: Every Day    Current packs/day: 1.00    Average packs/day: 1 pack/day for 30.0 years (30.0 ttl pk-yrs)    Types: Cigarettes    Start date: 12/23/1993   Smokeless tobacco: Never  Vaping Use   Vaping status: Never Used  Substance and Sexual Activity   Alcohol use: Not Currently    Alcohol/week: 3.0 standard drinks of alcohol    Types: 3 Cans of beer per week    Comment: stopped on 09/27   Drug use: Not Currently   Sexual activity: Not on file  Other Topics Concern   Not on file  Social History Narrative   Not on file   Social Drivers  of Health   Financial Resource Strain: Not on file  Food Insecurity: Patient Declined (12/21/2023)   Hunger Vital Sign    Worried About Running Out of Food in the Last Year: Patient declined    Ran Out of Food in the Last Year: Patient declined  Transportation Needs: No Transportation Needs (12/24/2023)   PRAPARE - Administrator, Civil Service (Medical): No    Lack of Transportation (Non-Medical): No  Physical Activity: Not on file  Stress: Not on file  Social Connections: Patient Declined (12/21/2023)   Social Connection and Isolation Panel    Frequency of Communication with Friends and Family: Patient declined    Frequency  of Social Gatherings with Friends and Family: Patient declined    Attends Religious Services: Patient declined    Database administrator or Organizations: Patient declined    Attends Banker Meetings: Patient declined    Marital Status: Patient declined  Intimate Partner Violence: Patient Declined (12/21/2023)   Humiliation, Afraid, Rape, and Kick questionnaire    Fear of Current or Ex-Partner: Patient declined    Emotionally Abused: Patient declined    Physically Abused: Patient declined    Sexually Abused: Patient declined   History reviewed. No pertinent family history.  Vitals:   01/05/24 1012  BP: 118/66  Pulse: 90  SpO2: 97%  Weight: 78.7 kg (173 lb 6.4 oz)  Height: 5' 9 (1.753 m)    PHYSICAL EXAM: General: Well appearing. No distress on RA Cardiac: JVP flat. S1 and S2 present. No murmurs or rub. Extremities: Warm and dry.  No edema.  Neuro: Alert and oriented x3. Affect pleasant. Moves all extremities without difficulty.  ASSESSMENT & PLAN: Chronic HFrEF - new diagnosis 10/1. 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  - recommend ischemic evaluation - TSH elevated during admission, check thyroid studies today - NYHA II. Euvolemic. Give PRN Lasix 20 mg - GDMT  ? blocker: continue toprol -XL 50 mg daily ARB/ARNI: BP too low to add today MRA: add spiro 25 mg daily, CMET today then repeat BMET in 10-14 days SGLT2i: continue jardiance 10 mg daily - Stop ibuprofen  - Repeat BMET in 2-3 months, after abstinence from ETOH and GDMT  HLD - with concern for possible ischemic disease, add atorva 40 mg daily   ETOH abuse - had been drinking ~8 beers/day - reports abstinence since last admission, congratulated  Referred to HFSW (PCP, Medications, Transportation, ETOH Abuse, Drug Abuse, Insurance, Financial): No Refer to Pharmacy: No Refer to Home Health: No Refer to Advanced Heart Failure Clinic: No Refer to  General Cardiology: Yes  [place] Farida Mcreynolds, NP 01/05/24

## 2024-01-02 ENCOUNTER — Telehealth (HOSPITAL_COMMUNITY): Payer: Self-pay

## 2024-01-02 NOTE — Telephone Encounter (Signed)
 Called to confirm/remind patient of their appointment at the [facility] on 01/05/24 9:45.   Appointment:   [x] Confirmed  [] Left mess   [] No answer/No voice mail  [] VM Full/unable to leave message  [] Phone not in service  Patient reminded to bring all medications and/or complete list.  Confirmed patient has transportation. Gave directions, instructed to utilize valet parking.

## 2024-01-05 ENCOUNTER — Other Ambulatory Visit (HOSPITAL_COMMUNITY): Payer: Self-pay

## 2024-01-05 ENCOUNTER — Encounter (HOSPITAL_COMMUNITY): Payer: Self-pay

## 2024-01-05 ENCOUNTER — Ambulatory Visit (HOSPITAL_COMMUNITY)
Admit: 2024-01-05 | Discharge: 2024-01-05 | Disposition: A | Discharge status: Home / Self Care | Attending: Cardiology | Admitting: Cardiology

## 2024-01-05 ENCOUNTER — Other Ambulatory Visit (HOSPITAL_BASED_OUTPATIENT_CLINIC_OR_DEPARTMENT_OTHER): Payer: Self-pay

## 2024-01-05 VITALS — BP 118/66 | HR 90 | Ht 69.0 in | Wt 173.4 lb

## 2024-01-05 DIAGNOSIS — I5022 Chronic systolic (congestive) heart failure: Secondary | ICD-10-CM | POA: Diagnosis not present

## 2024-01-05 DIAGNOSIS — F1010 Alcohol abuse, uncomplicated: Secondary | ICD-10-CM | POA: Diagnosis not present

## 2024-01-05 DIAGNOSIS — Z7984 Long term (current) use of oral hypoglycemic drugs: Secondary | ICD-10-CM | POA: Insufficient documentation

## 2024-01-05 DIAGNOSIS — R000 Tachycardia, unspecified: Secondary | ICD-10-CM | POA: Diagnosis not present

## 2024-01-05 DIAGNOSIS — F17210 Nicotine dependence, cigarettes, uncomplicated: Secondary | ICD-10-CM | POA: Insufficient documentation

## 2024-01-05 DIAGNOSIS — Z79899 Other long term (current) drug therapy: Secondary | ICD-10-CM | POA: Insufficient documentation

## 2024-01-05 DIAGNOSIS — I5020 Unspecified systolic (congestive) heart failure: Secondary | ICD-10-CM | POA: Diagnosis not present

## 2024-01-05 DIAGNOSIS — E785 Hyperlipidemia, unspecified: Secondary | ICD-10-CM | POA: Diagnosis not present

## 2024-01-05 LAB — COMPREHENSIVE METABOLIC PANEL WITH GFR
ALT: 42 U/L (ref 0–44)
AST: 38 U/L (ref 15–41)
Albumin: 3.3 g/dL — ABNORMAL LOW (ref 3.5–5.0)
Alkaline Phosphatase: 89 U/L (ref 38–126)
Anion gap: 9 (ref 5–15)
BUN: <5 mg/dL — ABNORMAL LOW (ref 6–20)
CO2: 25 mmol/L (ref 22–32)
Calcium: 9.3 mg/dL (ref 8.9–10.3)
Chloride: 105 mmol/L (ref 98–111)
Creatinine, Ser: 1.03 mg/dL (ref 0.61–1.24)
GFR, Estimated: >60 mL/min (ref >60)
Glucose, Bld: 87 mg/dL (ref 70–99)
Potassium: 3.5 mmol/L (ref 3.5–5.1)
Sodium: 139 mmol/L (ref 135–145)
Total Bilirubin: 0.8 mg/dL (ref 0.0–1.2)
Total Protein: 8.2 g/dL — ABNORMAL HIGH (ref 6.5–8.1)

## 2024-01-05 LAB — T4, FREE: Free T4: 0.72 ng/dL (ref 0.61–1.12)

## 2024-01-05 LAB — TSH: TSH: 1.307 u[IU]/mL (ref 0.350–4.500)

## 2024-01-05 MED ORDER — SPIRONOLACTONE 25 MG PO TABS
25.0000 mg | ORAL_TABLET | Freq: Every day | ORAL | 5 refills | Status: DC
  Filled 2024-01-05: qty 30, 30d supply, fill #0
  Filled 2024-01-27: qty 30, 30d supply, fill #1

## 2024-01-05 MED ORDER — FUROSEMIDE 20 MG PO TABS
20.0000 mg | ORAL_TABLET | Freq: Every day | ORAL | 3 refills | Status: AC | PRN
  Filled 2024-01-05: qty 30, 30d supply, fill #0

## 2024-01-05 MED ORDER — ATORVASTATIN CALCIUM 40 MG PO TABS
40.0000 mg | ORAL_TABLET | Freq: Every day | ORAL | 3 refills | Status: DC
  Filled 2024-01-05: qty 30, 30d supply, fill #0
  Filled 2024-01-27: qty 30, 30d supply, fill #1

## 2024-01-05 NOTE — Patient Instructions (Signed)
 Medication Changes:  START SPIRONOLACTONE 25MG  ONCE DAILY   START LASIX (FUROSEMIDE) 20MG  ONCE DAILY AS NEEDED FOR SWELLING OR WEIGHT GAIN OF 3 POUNDS OVERNIGHT OR 5 POUNDS IN 1 WEEK   START ATORVASTATIN 40MG  ONCE DAILY   STOP TAKING IBUPROFEN    Lab Work:  Labs done today, your results will be available in MyChart, we will contact you for abnormal readings.  AND THEN PLEASE RETURN FOR LABS AGAIN IN 2 WEEKS AS SCHEDULED   Referrals:  YOU HAVE BEEN REFERRED TO GENERAL CARDIOLOGY DR. Theodore'S OFFICE THEY WILL REACH OUT TO YOU OR CALL TO ARRANGE THIS. PLEASE CALL US  WITH ANY CONCERNS   Follow-Up in: WITH DR. Alamo'S OFFICE (GENERAL CARDIOLOGY)   At the [facility], you and your health needs are our priority. We have a designated team specialized in the treatment of Heart Failure. This Care Team includes your primary Heart Failure Specialized Cardiologist (physician), Advanced Practice Providers (APPs- Physician Assistants and Nurse Practitioners), and Pharmacist who all work together to provide you with the care you need, when you need it.   You may see any of the following providers on your designated Care Team at your next follow up:  Dr. Toribio Fuel Dr. Ezra Shuck Dr. Ria Commander Dr. Odis Brownie Greig Mosses, NP Caffie Shed, [place] [facility] [place], [place] Beckey Coe, NP [place] Lee, NP Tinnie Redman, PharmD   Please be sure to bring in all your medications bottles to every appointment.   Need to Contact Us :  If you have any questions or concerns before your next appointment please send us  a message through [place] or call our office at [phone].    TO LEAVE A MESSAGE FOR THE NURSE SELECT OPTION 2, PLEASE LEAVE A MESSAGE INCLUDING: YOUR NAME DATE OF BIRTH CALL BACK NUMBER REASON FOR CALL**this is important as we prioritize the call backs  YOU WILL RECEIVE A CALL BACK THE SAME DAY AS LONG AS YOU CALL BEFORE 4:00 PM

## 2024-01-06 LAB — T3, FREE: T3, Free: 3.5 pg/mL (ref 2.0–4.4)

## 2024-01-07 ENCOUNTER — Ambulatory Visit: Payer: Self-pay | Admitting: Cardiovascular Disease

## 2024-01-07 ENCOUNTER — Ambulatory Visit (HOSPITAL_COMMUNITY): Payer: Self-pay | Admitting: Cardiology

## 2024-01-19 ENCOUNTER — Other Ambulatory Visit (HOSPITAL_BASED_OUTPATIENT_CLINIC_OR_DEPARTMENT_OTHER): Payer: Self-pay

## 2024-01-19 ENCOUNTER — Ambulatory Visit (HOSPITAL_COMMUNITY)
Admission: RE | Admit: 2024-01-19 | Discharge: 2024-01-19 | Disposition: A | Discharge status: Home / Self Care | Source: Ambulatory Visit | Attending: Adult Health | Admitting: Adult Health

## 2024-01-19 DIAGNOSIS — I5020 Unspecified systolic (congestive) heart failure: Secondary | ICD-10-CM | POA: Insufficient documentation

## 2024-01-19 LAB — BASIC METABOLIC PANEL WITH GFR
Anion gap: 14 (ref 5–15)
BUN: 6 mg/dL (ref 6–20)
CO2: 22 mmol/L (ref 22–32)
Calcium: 9.4 mg/dL (ref 8.9–10.3)
Chloride: 104 mmol/L (ref 98–111)
Creatinine, Ser: 1.16 mg/dL (ref 0.61–1.24)
GFR, Estimated: >60 mL/min (ref >60)
Glucose, Bld: 102 mg/dL — ABNORMAL HIGH (ref 70–99)
Potassium: 3.6 mmol/L (ref 3.5–5.1)
Sodium: 140 mmol/L (ref 135–145)

## 2024-01-19 MED ORDER — OMEPRAZOLE 40 MG PO CPDR
40.0000 mg | DELAYED_RELEASE_CAPSULE | Freq: Every day | ORAL | 2 refills | Status: AC
  Filled 2024-01-19: qty 60, 60d supply, fill #0

## 2024-01-20 ENCOUNTER — Ambulatory Visit (HOSPITAL_COMMUNITY): Payer: Self-pay | Admitting: Cardiology

## 2024-01-21 ENCOUNTER — Other Ambulatory Visit (HOSPITAL_BASED_OUTPATIENT_CLINIC_OR_DEPARTMENT_OTHER): Payer: Self-pay

## 2024-01-21 ENCOUNTER — Telehealth: Payer: Self-pay | Admitting: Cardiovascular Disease

## 2024-01-21 MED ORDER — JARDIANCE 10 MG PO TABS
10.0000 mg | ORAL_TABLET | Freq: Every day | ORAL | 1 refills | Status: DC
  Filled 2024-01-21 (×2): qty 30, 30d supply, fill #0

## 2024-01-21 NOTE — Telephone Encounter (Signed)
*  STAT* If patient is at the pharmacy, call can be transferred to refill team.   1. Which medications need to be refilled? (please list name of each medication and dose if known) empagliflozin (JARDIANCE) 10 MG TABS tablet   2. Which pharmacy/location (including street and city if local pharmacy) is medication to be sent to? [facility]   3. Do they need a 30 day or 90 day supply? 90

## 2024-01-22 ENCOUNTER — Other Ambulatory Visit (HOSPITAL_BASED_OUTPATIENT_CLINIC_OR_DEPARTMENT_OTHER): Payer: Self-pay

## 2024-01-22 ENCOUNTER — Other Ambulatory Visit (HOSPITAL_COMMUNITY): Payer: Self-pay

## 2024-01-22 MED ORDER — EMPAGLIFLOZIN 10 MG PO TABS
10.0000 mg | ORAL_TABLET | Freq: Every day | ORAL | 0 refills | Status: DC
  Filled 2024-01-22: qty 30, 30d supply, fill #0

## 2024-01-23 ENCOUNTER — Other Ambulatory Visit (HOSPITAL_BASED_OUTPATIENT_CLINIC_OR_DEPARTMENT_OTHER): Payer: Self-pay

## 2024-01-26 ENCOUNTER — Ambulatory Visit (INDEPENDENT_AMBULATORY_CARE_PROVIDER_SITE_OTHER): Admitting: Cardiovascular Disease

## 2024-01-26 ENCOUNTER — Encounter (HOSPITAL_BASED_OUTPATIENT_CLINIC_OR_DEPARTMENT_OTHER): Payer: Self-pay | Admitting: Cardiovascular Disease

## 2024-01-26 ENCOUNTER — Other Ambulatory Visit (HOSPITAL_BASED_OUTPATIENT_CLINIC_OR_DEPARTMENT_OTHER): Payer: Self-pay

## 2024-01-26 ENCOUNTER — Encounter (HOSPITAL_BASED_OUTPATIENT_CLINIC_OR_DEPARTMENT_OTHER): Payer: Self-pay | Admitting: *Deleted

## 2024-01-26 ENCOUNTER — Other Ambulatory Visit (HOSPITAL_COMMUNITY): Payer: Self-pay

## 2024-01-26 VITALS — BP 110/64 | HR 86 | Ht 69.0 in | Wt 178.6 lb

## 2024-01-26 DIAGNOSIS — I4711 Inappropriate sinus tachycardia, so stated: Secondary | ICD-10-CM | POA: Diagnosis not present

## 2024-01-26 DIAGNOSIS — Z720 Tobacco use: Secondary | ICD-10-CM | POA: Diagnosis not present

## 2024-01-26 DIAGNOSIS — F1090 Alcohol use, unspecified, uncomplicated: Secondary | ICD-10-CM

## 2024-01-26 DIAGNOSIS — I5020 Unspecified systolic (congestive) heart failure: Secondary | ICD-10-CM | POA: Diagnosis not present

## 2024-01-26 DIAGNOSIS — Z5181 Encounter for therapeutic drug level monitoring: Secondary | ICD-10-CM

## 2024-01-26 MED ORDER — LOSARTAN POTASSIUM 25 MG PO TABS: 12.5000 mg | ORAL_TABLET | Freq: Every day | ORAL | 3 refills | Status: DC

## 2024-01-26 MED ORDER — LOSARTAN POTASSIUM 25 MG PO TABS: 25.0000 mg | ORAL_TABLET | Freq: Every day | ORAL | 3 refills | Status: DC

## 2024-01-26 NOTE — Patient Instructions (Addendum)
 Medication Instructions:  START LOSARTAN 12.5 MG DAILY   *If you need a refill on your cardiac medications before your next appointment, please call your pharmacy*  Lab Work: BMET IN 1 WEEK   If you have labs (blood work) drawn today and your tests are completely normal, you will receive your results only by: MyChart Message (if you have MyChart) OR A paper copy in the mail If you have any lab test that is abnormal or we need to change your treatment, we will call you to review the results.  Testing/Procedures: .Your physician has requested that you have an echocardiogram. Echocardiography is a painless test that uses sound waves to create images of your heart. It provides your doctor with information about the size and shape of your heart and how well your heart's chambers and valves are working. This procedure takes approximately one hour. There are no restrictions for this procedure. Please do NOT wear cologne, perfume, aftershave, or lotions (deodorant is allowed). Please arrive 15 minutes prior to your appointment time.  Please note: We ask at that you not bring children with you during ultrasound (echo/ vascular) testing. Due to room size and safety concerns, children are not allowed in the ultrasound rooms during exams. Our front office staff cannot provide observation of children in our lobby area while testing is being conducted. An adult accompanying a patient to their appointment will only be allowed in the ultrasound room at the discretion of the ultrasound technician under special circumstances. We apologize for any inconvenience. TO BE DONE END OF DECEMBER   Follow-Up: At [facility], you and your health needs are our priority.  As part of our continuing mission to provide you with exceptional heart care, our providers are all part of one team.  This team includes your primary Cardiologist (physician) and Advanced Practice Providers or APPs (Physician Assistants and  Nurse Practitioners) who all work together to provide you with the care you need, when you need it.  Your next appointment:   AFTER ECHO   Provider:   Annabella Scarce, MD, Rosaline Bane, NP, or Reche Finder, NP    We recommend signing up for the patient portal called MyChart.  Sign up information is provided on this After Visit Summary.  MyChart is used to connect with patients for Virtual Visits (Telemedicine).  Patients are able to view lab/test results, encounter notes, upcoming appointments, etc.  Non-urgent messages can be sent to your provider as well.   To learn more about what you can do with MyChart, go to [email or web address].   Other Instructions ISABEL OUR SOCIAL WORKER WILL BE IN [facility]

## 2024-01-26 NOTE — Progress Notes (Signed)
 Cardiology Office Note:  .   Date:  01/26/2024  ID:  Jamie Campos, DOB 21-Dec-1970, MRN [id number] PCP: Arloa Elsie SAUNDERS, MD  [facility] Providers Cardiologist:  None    History of Present Illness: .    Jamie Campos is a 53 y.o. male with HFrEF, alcohol use disorder, prior pancreatitis, and tobacco abuse here for follow-up.  He was first seen in the hospital 12/2023 with new onset HFrEF.  He was also noted to be tachycardic and it was unclear whether his heart failure was due to tachycardia or alcohol use.  He initially presents vented with abdominal pain, nausea, and vomiting.  He was treated for uncomplicated pancreatitis.  Due to persistent tachycardia an echocardiogram was performed which revealed LVEF 35-40% with mild LVH and anterior and anterior septal hypokinesis.  GDMT was limited by hypotension.  At his follow-up 12/2023 spironolactone was added.  He had been successful at abstaining from alcohol use.   Discussed the use of AI scribe software for clinical note transcription with the patient, who gave verbal consent to proceed.  History of Present Illness Jamie Campos has a history of heart failure with a previously measured ejection fraction of t35-4 percent. He feels better overall and has been staying active, noting improvement in his condition. No chest pain, shortness of breath, or swelling in his legs or feet. He has not been monitoring his blood pressure at home, but it was noted to be higher today compared to his last hospital visit.  He mentions a rash on his pinky, which he suspects might be eczema. He has not had eczema his whole life, but it has been present for a while without treatment.  In terms of diet, he has been consuming fresh fruits, particularly fresh pineapple, and is mindful of avoiding high-sodium foods. He is currently out of work and has been offered access to a programme researcher, broadcasting/film/video for heart-healthy options.  ROS:  As per HPI  Studies Reviewed: .        Echo 12/23/23:   1. Left ventricular ejection fraction, by estimation, is 35 to 40%. The  left ventricle has moderately decreased function. The left ventricle  demonstrates regional wall motion abnormalities (see scoring  diagram/findings for description). There is mild  left ventricular hypertrophy. Left ventricular diastolic function could  not be evaluated.   2. Right ventricular systolic function is normal. The right ventricular  size is normal.   3. The mitral valve is normal in structure. No evidence of mitral valve  regurgitation. Moderate mitral annular calcification.   4. The aortic valve is normal in structure. Aortic valve regurgitation is  not visualized.   Risk Assessment/Calculations:             Physical Exam:   VS:  BP 112/68 (BP Location: Left Arm, Patient Position: Sitting, Cuff Size: Normal)   Pulse 86   Ht 5' 9 (1.753 m)   Wt 178 lb 9.6 oz (81 kg)   SpO2 98%   BMI 26.37 kg/m  , BMI Body mass index is 26.37 kg/m. GENERAL:  Well appearing HEENT: Pupils equal round and reactive, fundi not visualized, oral mucosa unremarkable NECK:  No jugular venous distention, waveform within normal limits, carotid upstroke brisk and symmetric, no bruits, no thyromegaly LUNGS:  Clear to auscultation bilaterally HEART:  RRR.  PMI not displaced or sustained,S1 and S2 within normal limits, no S3, no S4, no clicks, no rubs, no murmurs ABD:  Flat, positive bowel sounds normal  in frequency in pitch, no bruits, no rebound, no guarding, no midline pulsatile mass, no hepatomegaly, no splenomegaly EXT:  2 plus pulses throughout, no edema, no cyanosis no clubbing SKIN:  No rashes no nodules NEURO:  Cranial nerves II through XII grossly intact, motor grossly intact throughout PSYCH:  Cognitively intact, oriented to person place and time   ASSESSMENT AND PLAN: .    Assessment & Plan # HFrEF:  Systolic heart failure with ejection fraction of 35-40%.  He is euvolemic and doing  well.  Asymptomatic with no dyspnea, edema, or chest pain. Driving restrictions for commercial vehicles lifted. - Prescribe losartan 12.5 mg daily. - Continue metoprolol , spironolactone and Jardiance - Order BMP in one week to monitor potassium levels. - Schedule echocardiogram in December to reassess cardiac function. - If LVEF remains reduced will need to send for ischemic evaluation.  # Alcohol use Abstained from alcohol, beneficial for cardiac health. Educated on risks of alcohol consumption and benefits of abstinence. - Continue to abstain from alcohol.        Dispo: f/u after echo  Signed, Annabella Scarce, MD

## 2024-01-26 NOTE — Addendum Note (Signed)
 Addended by: FREDIRICK BEAU B on: 01/26/2024 11:44 AM   Modules accepted: Orders

## 2024-01-27 ENCOUNTER — Other Ambulatory Visit (HOSPITAL_BASED_OUTPATIENT_CLINIC_OR_DEPARTMENT_OTHER): Payer: Self-pay

## 2024-01-27 ENCOUNTER — Telehealth: Payer: Self-pay | Admitting: Licensed Clinical Social Worker

## 2024-01-27 ENCOUNTER — Other Ambulatory Visit: Payer: Self-pay | Admitting: Cardiovascular Disease

## 2024-01-27 NOTE — Progress Notes (Signed)
 Heart and Vascular Care Navigation  01/27/2024  GAREK SCHUNEMAN 07/24/1970 [id number]  Reason for Referral: financial challenges   Engaged with patient by telephone for initial visit for Heart and Vascular Care Coordination.                                                                                                   Assessment:         LCSW was able to reach pt this morning at [phone]. Introduced self, role, reason for call. When I confirmed home address and contact information pt requests I remove his spouse from contact list and add his sister. He shares they are divorcing and he'd prefer her not have access to medical records. I shared that he completed a DPR form yesterday with her information and that I can f/u with patient access teams regarding updating that.   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 He does not have any benefits through work since he is self-employed. His spouse carries him on her insurance. He does not meet threshold for financial assistance through [facility] for [place] program with medical bills. Does not receive SNAP, therefore not eligible for Patient Care Fund. He is concerned about housing, utilities, food, and insurance if they separate. Pt does not have savings at this time, we discussed that he should speak with family/friends regarding housing options as needed given relatively limited options in community for housing assistance.                  LCSW offered referral to [facility] DSS caseworkers to discuss Medicaid eligibility should he lose coverage and when/how to apply, he is okay with me doing this. Offered to send pt resources for community- he is amenable to these being sent to home address.                 Otherwise when asked no specific needs at this time, sounds as though lots of things currently in flux/dependent on relationship changes. Encouraged him to call me as needed as some  of these things progress.   HRT/VAS Care Coordination     Patients Home Cardiology Office [facility]   Outpatient Care Team Social Worker   Social Worker Name: Marit Lark, [place], [phone]   Living arrangements for the past 2 months Single Family Home   Lives with: Spouse; Pets   Patient Current Optometrist   Patient Has Concern With Paying Medical Bills No   Does Patient Have Prescription Coverage? Yes   Home Assistive Devices/Equipment None   DME Agency [employer]       Social History:  SDOH Screenings   Food Insecurity: Food Insecurity Present (01/27/2024)  Housing: Low Risk  (01/27/2024)  Transportation Needs: No Transportation Needs (01/27/2024)  Utilities: Not At Risk (01/27/2024)  Alcohol Screen: Low Risk  (12/24/2023)  Financial Resource Strain: Medium Risk (01/27/2024)  Social Connections: Patient Declined (12/21/2023)  Tobacco Use: High Risk (01/26/2024)  Health Literacy: Adequate Health Literacy (01/27/2024)    SDOH Interventions: Financial Resources:  Financial Strain Interventions: Other (Comment) (currently residing with spouse, sent resources for food, housing, utilities and referred for Medicaid as challenges with marriage) DSS for financial assistance and Surveyor, Quantity Counseling for [employer] Discount Program  Food Insecurity:  Food Insecurity Interventions: [employer] Provided (will send the [employer] company and pt was offered food bag at [place])  Housing Insecurity:  Housing Interventions: [employer] Provided (currently residing with spouse but asks for housing resources)  Transportation:   Transportation Interventions: Intervention Not Indicated    Other Care Navigation Interventions:     Provided Pharmacy assistance resources  Currently has pharmacy benefits    Follow-up plan:   LCSW was able to speak with patient access team and f/u with pt  regarding DPR. Will need to stop by and fill out a new one or we can mail him one. Pt also provided verbally with HIM team number to speak about previous releases and how that may effect what spouse can see or not. LCSW mailed pt my card, Medicaid flyer, [employer] program, Marketplace flyer and community resources for food, housing, and rent/utilities. I have also made referral to [facility] [place] caseworker will reach out to him to discuss options/process to apply.

## 2024-01-29 MED ORDER — ATORVASTATIN CALCIUM 40 MG PO TABS: 40.0000 mg | ORAL_TABLET | Freq: Every day | ORAL | 3 refills | Status: DC

## 2024-01-30 MED ORDER — SPIRONOLACTONE 25 MG PO TABS: 25.0000 mg | ORAL_TABLET | Freq: Every day | ORAL | 3 refills | Status: AC

## 2024-01-30 MED ORDER — JARDIANCE 25 MG PO TABS: 25.0000 mg | ORAL_TABLET | Freq: Every day | ORAL | 3 refills | Status: DC

## 2024-01-30 MED ORDER — LOSARTAN POTASSIUM 25 MG PO TABS: 12.5000 mg | ORAL_TABLET | Freq: Every day | ORAL | 3 refills | Status: AC

## 2024-01-30 MED ORDER — METOPROLOL SUCCINATE ER 50 MG PO TB24: 50.0000 mg | ORAL_TABLET | Freq: Every day | ORAL | 3 refills | Status: AC

## 2024-02-02 ENCOUNTER — Other Ambulatory Visit: Payer: Self-pay | Admitting: Cardiovascular Disease

## 2024-02-05 LAB — BASIC METABOLIC PANEL WITH GFR
BUN/Creatinine Ratio: 6 — ABNORMAL LOW (ref 9–20)
BUN: 6 mg/dL (ref 6–24)
CO2: 23 mmol/L (ref 20–29)
Calcium: 9.4 mg/dL (ref 8.7–10.2)
Chloride: 104 mmol/L (ref 96–106)
Creatinine, Ser: 1.09 mg/dL (ref 0.76–1.27)
Glucose: 85 mg/dL (ref 70–99)
Potassium: 4.1 mmol/L (ref 3.5–5.2)
Sodium: 143 mmol/L (ref 134–144)
eGFR: 82 mL/min/1.73 (ref >59)

## 2024-02-06 ENCOUNTER — Other Ambulatory Visit: Payer: Self-pay | Admitting: Cardiovascular Disease

## 2024-02-06 MED ORDER — ATORVASTATIN CALCIUM 40 MG PO TABS: 40.0000 mg | ORAL_TABLET | Freq: Every day | ORAL | 3 refills | Status: AC

## 2024-02-10 ENCOUNTER — Ambulatory Visit: Payer: Self-pay | Admitting: Cardiovascular Disease

## 2024-02-10 NOTE — Telephone Encounter (Signed)
 Pt of Dr. Raford. Does Dr. Raford want to refill this RX? Please advise.

## 2024-02-11 ENCOUNTER — Other Ambulatory Visit: Payer: Self-pay | Admitting: Cardiovascular Disease

## 2024-02-12 ENCOUNTER — Emergency Department (HOSPITAL_BASED_OUTPATIENT_CLINIC_OR_DEPARTMENT_OTHER)
Admission: EM | Admit: 2024-02-12 | Discharge: 2024-02-12 | Disposition: A | Discharge status: Home / Self Care | Attending: Emergency Medicine | Admitting: Emergency Medicine

## 2024-02-12 ENCOUNTER — Encounter (HOSPITAL_BASED_OUTPATIENT_CLINIC_OR_DEPARTMENT_OTHER): Payer: Self-pay | Admitting: Urology

## 2024-02-12 ENCOUNTER — Other Ambulatory Visit: Payer: Self-pay

## 2024-02-12 ENCOUNTER — Emergency Department (HOSPITAL_BASED_OUTPATIENT_CLINIC_OR_DEPARTMENT_OTHER)

## 2024-02-12 DIAGNOSIS — W208XXA Other cause of strike by thrown, projected or falling object, initial encounter: Secondary | ICD-10-CM | POA: Insufficient documentation

## 2024-02-12 DIAGNOSIS — S92425A Nondisplaced fracture of distal phalanx of left great toe, initial encounter for closed fracture: Secondary | ICD-10-CM | POA: Diagnosis not present

## 2024-02-12 DIAGNOSIS — M79675 Pain in left toe(s): Secondary | ICD-10-CM | POA: Diagnosis present

## 2024-02-12 MED ORDER — OXYCODONE-ACETAMINOPHEN 5-325 MG PO TABS
1.0000 | ORAL_TABLET | Freq: Once | ORAL | Status: AC
  Administered 2024-02-12: 1 via ORAL
  Filled 2024-02-12: qty 1

## 2024-02-12 NOTE — ED Notes (Signed)
 Could you please do his d/c  TY

## 2024-02-12 NOTE — Discharge Instructions (Addendum)
 Thank you for visiting the Emergency Department today. It was a pleasure to be part of your healthcare team. Your diagnosis is fracture of the left great toe.  As discussed, at home, rest, utilize ice, elevation, and over-the-counter pain relievers such as ibuprofen  and Tylenol  as needed for pain.  Utilize the buddy tape and postop shoe until you follow-up with orthopedics.  It is important to watch for warning signs such as worsening pain or inability to ambulate. If any of these happen, return to the Emergency Department or call 911. Thank you for trusting us  with your health.

## 2024-02-12 NOTE — ED Provider Notes (Signed)
 [facility] Provider Note   CSN: [id number] Arrival date & time: 02/12/24  1037     Patient presents with: Toe Injury   Jamie Campos is a 53 y.o. male presents with left great toe pain after dropping a car battery directly on his toe.  He reports localized pain to the left great toe, worsened with movement.  He denies loss of range of motion, loss of sensation, or injury to the remainder of the foot.  No other trauma reported.  Patient in no acute distress.   HPI     Prior to Admission medications   Medication Sig Start Date End Date Taking? Authorizing Provider  atorvastatin  (LIPITOR) 40 MG tablet Take 1 tablet (40 mg total) by mouth daily. 02/06/24 05/06/24  Raford Riggs, MD  furosemide  (LASIX ) 20 MG tablet Take 1 tablet (20 mg total) by mouth daily as needed (FOR SWELLING OR WEIGHT GAIN OF 3 POUNDS OVERNIGHT OR 5 POUNDS IN 1 WEEK). 01/05/24   Lee, Jordan, NP  JARDIANCE  25 MG TABS tablet Take 1 tablet (25 mg total) by mouth daily. 01/30/24   Raford Riggs, MD  losartan  (COZAAR ) 25 MG tablet Take 0.5 tablets (12.5 mg total) by mouth daily. 01/30/24 04/29/24  Raford Riggs, MD  metoprolol  succinate (TOPROL -XL) 50 MG 24 hr tablet Take 1 tablet (50 mg total) by mouth daily. Take with or immediately following a meal. 01/30/24 02/29/24  Raford Riggs, MD  nicotine  (NICODERM CQ ) 21 mg/24hr patch Place 1 patch (21 mg total) onto the skin daily. Patient taking differently: Place 21 mg onto the skin daily as needed (smoking cessation). 12/25/23     omeprazole  (PRILOSEC) 40 MG capsule Take 1 capsule (40 mg total) by mouth daily before breakfast. 01/19/24     spironolactone  (ALDACTONE ) 25 MG tablet Take 1 tablet (25 mg total) by mouth daily. 01/30/24   Raford Riggs, MD    Allergies: Patient has no known allergies.    Review of Systems  Musculoskeletal:        Left great toe pain    Updated Vital Signs BP 124/68 (BP Location:  Right Arm) Comment: Simultaneous filing. User may not have seen previous data.  Pulse 87   Temp 97.6 F (36.4 C) (Oral) Comment: Simultaneous filing. User may not have seen previous data. Comment (Src): Simultaneous filing. User may not have seen previous data.  Resp 18 Comment: Simultaneous filing. User may not have seen previous data.  Ht 5' 9 (1.753 m)   Wt 81 kg   SpO2 100%   BMI 26.37 kg/m   Physical Exam Vitals and nursing note reviewed.  Constitutional:      General: He is not in acute distress.    Appearance: Normal appearance.  HENT:     Head: Normocephalic and atraumatic.  Eyes:     Extraocular Movements: Extraocular movements intact.     Conjunctiva/sclera: Conjunctivae normal.     Pupils: Pupils are equal, round, and reactive to light.  Cardiovascular:     Rate and Rhythm: Normal rate and regular rhythm.     Pulses: Normal pulses.  Pulmonary:     Effort: Pulmonary effort is normal. No respiratory distress.  Musculoskeletal:        General: Normal range of motion.     Cervical back: Normal range of motion.  Feet:     Comments: Swelling and tenderness localized to the left great toe.  No obvious deformity.  No ecchymosis or open injury.  Full sensation intact.  Capillary refill brisk.  Distal pulses intact.  No tenderness to the remainder of the foot. Skin:    General: Skin is warm and dry.     Capillary Refill: Capillary refill takes less than 2 seconds.  Neurological:     General: No focal deficit present.     Mental Status: He is alert. Mental status is at baseline.  Psychiatric:        Mood and Affect: Mood normal.     (Jamie labs ordered are listed, but only abnormal results are displayed) Labs Reviewed - No data to display  EKG: None  Radiology: No results found.   Procedures   Medications Ordered in the ED  oxyCODONE -acetaminophen  (PERCOCET/ROXICET) 5-325 MG per tablet 1 tablet (1 tablet Oral Given 02/12/24 1332)    Clinical Course as of  02/23/24 0322  Thu Feb 12, 2024  1135 DG Toe Great Left [ML]    Clinical Course User Index [ML] Jamie Campos, [place]                                 Medical Decision Making Amount and/or Complexity of Data Reviewed Radiology: ordered. Decision-making details documented in ED Course.  Risk Prescription drug management.   Patient presents to the ED for concern of left great toe pain, this involves an extensive number of treatment options.  The differential diagnosis includes: Fracture/dislocation Minor MSK  Imaging Studies ordered: I ordered imaging studies including: DG left great toe I independently visualized and interpreted imaging which showed: Nondisplaced fracture of the distal phalanx of the great toe with possible intra-articular extension. I agree with the radiologist interpretation  Medicines ordered and prescription drug management: I ordered medications: Oxycodone -acetaminophen  5 for pain  Reevaluation of the patient after these medicines showed that the patient improved I have reviewed the patients home medicines and have made adjustments as needed  Problem List / ED Course: Problem List: Left great toe pain Emergency [facility] Course: The patient presented with left great toe pain. Initial assessment included history, physical exam, and review of prior medical records.  Imaging was obtained and demonstrated a nondisplaced distal phalanx fracture of the right great toe with possible intra-auricular extension.  Patient was treated with oxycodone -acetaminophen  5 mg with adequate pain relief.  The great toe was buddy splinted, and the patient was placed in a post-op shoe for support and protected weightbearing.  Due to possible intra-articular involvement, he is advised to follow-up with orthopedics for further evaluation.  He tolerated treatment well and was discharged in stable condition with instructions on immobilization, pain control, and return precautions for  worsening pain, numbness, discoloration, or inability to bear weight. Reevaluation: After the interventions noted above, I reevaluated the patient and found that they have :improved  Dispostion: Patient safe for discharge.  Discharged with close follow-up with PCP for further evaluation and care.      Final diagnoses:  Closed nondisplaced fracture of distal phalanx of left great toe, initial encounter    ED Discharge Orders     None          Jamie Campos, [place] 02/23/24 9676    Dreama Longs, MD 02/24/24 [phone]

## 2024-02-12 NOTE — ED Triage Notes (Signed)
 Pt states dropped truck battery on left great toe yesterday  Pain with walking  Swelling and bruising noted   Doesn't know what to take for pain due to his heart meds

## 2024-02-23 ENCOUNTER — Encounter (HOSPITAL_BASED_OUTPATIENT_CLINIC_OR_DEPARTMENT_OTHER): Payer: Self-pay

## 2024-02-26 MED ORDER — NICOTINE 21 MG/24HR TD PT24: 21.0000 mg | MEDICATED_PATCH | Freq: Every day | TRANSDERMAL | 3 refills | Status: AC

## 2024-03-01 ENCOUNTER — Telehealth: Payer: Self-pay | Admitting: Licensed Clinical Social Worker

## 2024-03-01 NOTE — Telephone Encounter (Signed)
 H&V Care Navigation CSW Progress Note  Clinical Social Worker contacted patient by phone to f/u on resources sent. Was able to reach him today at [phone]. Confirmed resources received, no additional questions today- encouraged him to call me as needed with any additional questions/concerns that may arise. Otherwise states he is doing well at this time.  Patient is participating in a Managed Medicaid Plan:  No, [employer] commercial plan  SDOH Screenings   Food Insecurity: Food Insecurity Present (01/27/2024)  Housing: Low Risk  (01/27/2024)  Transportation Needs: No Transportation Needs (01/27/2024)  Utilities: Not At Risk (01/27/2024)  Alcohol Screen: Low Risk  (12/24/2023)  Financial Resource Strain: Medium Risk (01/27/2024)  Social Connections: Patient Declined (12/21/2023)  Tobacco Use: High Risk (02/12/2024)  Health Literacy: Adequate Health Literacy (01/27/2024)   Marit Lark, MSW, LCSW Clinical Social Worker II [facility] Heart/Vascular Care Navigation  [phone]- work cell phone (preferred)

## 2024-03-24 ENCOUNTER — Ambulatory Visit (INDEPENDENT_AMBULATORY_CARE_PROVIDER_SITE_OTHER)

## 2024-03-24 DIAGNOSIS — I4711 Inappropriate sinus tachycardia, so stated: Secondary | ICD-10-CM | POA: Diagnosis not present

## 2024-03-24 DIAGNOSIS — I5020 Unspecified systolic (congestive) heart failure: Secondary | ICD-10-CM | POA: Diagnosis not present

## 2024-03-24 LAB — ECHOCARDIOGRAM COMPLETE
Area-P 1/2: 2.91 cm2
Est EF: 50
S' Lateral: 3.15 cm

## 2024-03-24 MED ORDER — PERFLUTREN LIPID MICROSPHERE
1.0000 mL | INTRAVENOUS | Status: AC | PRN
  Administered 2024-03-24: 1 mL via INTRAVENOUS

## 2024-04-16 ENCOUNTER — Ambulatory Visit (INDEPENDENT_AMBULATORY_CARE_PROVIDER_SITE_OTHER): Admitting: Cardiovascular Disease

## 2024-04-16 ENCOUNTER — Encounter (HOSPITAL_BASED_OUTPATIENT_CLINIC_OR_DEPARTMENT_OTHER): Payer: Self-pay | Admitting: Cardiovascular Disease

## 2024-04-16 VITALS — BP 106/68 | HR 64 | Ht 69.0 in | Wt 186.2 lb

## 2024-04-16 DIAGNOSIS — Z720 Tobacco use: Secondary | ICD-10-CM

## 2024-04-16 DIAGNOSIS — F1090 Alcohol use, unspecified, uncomplicated: Secondary | ICD-10-CM

## 2024-04-16 DIAGNOSIS — E785 Hyperlipidemia, unspecified: Secondary | ICD-10-CM

## 2024-04-16 DIAGNOSIS — I4711 Inappropriate sinus tachycardia, so stated: Secondary | ICD-10-CM | POA: Diagnosis not present

## 2024-04-16 DIAGNOSIS — Z5181 Encounter for therapeutic drug level monitoring: Secondary | ICD-10-CM | POA: Diagnosis not present

## 2024-04-16 DIAGNOSIS — I5020 Unspecified systolic (congestive) heart failure: Secondary | ICD-10-CM | POA: Diagnosis not present

## 2024-04-16 LAB — COMPREHENSIVE METABOLIC PANEL WITH GFR
ALT: 18 IU/L (ref 0–44)
AST: 17 IU/L (ref 0–40)
Albumin: 4.7 g/dL (ref 3.8–4.9)
Alkaline Phosphatase: 124 IU/L — ABNORMAL HIGH (ref 47–123)
BUN/Creatinine Ratio: 8 — ABNORMAL LOW (ref 9–20)
BUN: 11 mg/dL (ref 6–24)
Bilirubin Total: 0.7 mg/dL (ref 0.0–1.2)
CO2: 25 mmol/L (ref 20–29)
Calcium: 10.1 mg/dL (ref 8.7–10.2)
Chloride: 99 mmol/L (ref 96–106)
Creatinine, Ser: 1.32 mg/dL — ABNORMAL HIGH (ref 0.76–1.27)
Globulin, Total: 3.5 g/dL (ref 1.5–4.5)
Glucose: 67 mg/dL — ABNORMAL LOW (ref 70–99)
Potassium: 4.2 mmol/L (ref 3.5–5.2)
Sodium: 140 mmol/L (ref 134–144)
Total Protein: 8.2 g/dL (ref 6.0–8.5)
eGFR: 65 mL/min/1.73

## 2024-04-16 LAB — LIPID PANEL
Chol/HDL Ratio: 3.7 ratio (ref 0.0–5.0)
Cholesterol, Total: 125 mg/dL (ref 100–199)
HDL: 34 mg/dL — ABNORMAL LOW
LDL Chol Calc (NIH): 65 mg/dL (ref 0–99)
Triglycerides: 151 mg/dL — ABNORMAL HIGH (ref 0–149)
VLDL Cholesterol Cal: 26 mg/dL (ref 5–40)

## 2024-04-16 NOTE — Patient Instructions (Signed)
 Medication Instructions:  Your physician recommends that you continue on your current medications as directed. Please refer to the Current Medication list given to you today.   *If you need a refill on your cardiac medications before your next appointment, please call your pharmacy*  Lab Work: LIPID/CMET TODAY   If you have labs (blood work) drawn today and your tests are completely normal, you will receive your results only by: MyChart Message (if you have MyChart) OR A paper copy in the mail If you have any lab test that is abnormal or we need to change your treatment, we will call you to review the results.  Testing/Procedures: NONE  Follow-Up: At [facility], you and your health needs are our priority.  As part of our continuing mission to provide you with exceptional heart care, our providers are all part of one team.  This team includes your primary Cardiologist (physician) and Advanced Practice Providers or APPs (Physician Assistants and Nurse Practitioners) who all work together to provide you with the care you need, when you need it.  Your next appointment:   6 month(s)  Provider:   Annabella Scarce, MD, Rosaline Bane, NP, or Reche Finder, NP    We recommend signing up for the patient portal called MyChart.  Sign up information is provided on this After Visit Summary.  MyChart is used to connect with patients for Virtual Visits (Telemedicine).  Patients are able to view lab/test results, encounter notes, upcoming appointments, etc.  Non-urgent messages can be sent to your provider as well.   To learn more about what you can do with MyChart, go to [email or web address].   Other Instructions

## 2024-04-16 NOTE — Progress Notes (Signed)
 " Cardiology Office Note:  .    Date:  04/16/2024  ID:  Jamie Campos, DOB 1971/03/07, MRN [id number] PCP: Arloa Elsie SAUNDERS, MD  [facility] Providers Cardiologist:  None     History of Present Illness: .    Jamie Campos is a 54 y.o. male with HFrEF, alcohol use disorder, prior pancreatitis, and tobacco abuse here for follow-up.  He was first seen in the hospital 12/2023 with new onset HFrEF.  He was also noted to be tachycardic and it was unclear whether his heart failure was due to tachycardia or alcohol use.  He initially presents vented with abdominal pain, nausea, and vomiting.  He was treated for uncomplicated pancreatitis.  Due to persistent tachycardia an echocardiogram was performed which revealed LVEF 35-40% with mild LVH and anterior and anterior septal hypokinesis.  GDMT was limited by hypotension.  At his follow-up 12/2023 spironolactone  was added.  He had been successful at abstaining from alcohol use.   At his visit 01/2024 he was feeling well.  She was abstaining from EtOH and euvolemic.  He was started on low dose losartan .  Repeat echo revealed an improvement in LVEF to 50%.    Discussed the use of AI scribe software for clinical note transcription with the patient, who gave verbal consent to proceed. History of Present Illness Jamie Campos has been doing well.  His most recent echocardiogram showed an ejection fraction of 50%, which was previously 35%. He has no symptoms of shortness of breath, swelling, lightheadedness, or dizziness. He has not been engaging in regular exercise.  He has a history of heart failure and coronary artery disease with some plaque in his heart arteries. He is currently taking medications for heart failure and atorvastatin  for cholesterol management. He has abstained from alcohol.  Socially, he has reduced his smoking to half a pack per day. He has tried nicotine  patches in the past but did not find them effective.  ROS:  As per  HPI  Studies Reviewed: .       Echo 03/24/24: 1. Left ventricular ejection fraction, by estimation, is 50%. Left  ventricular ejection fraction by 3D volume is 51 %. The left ventricle has  low normal function. The left ventricle has no regional wall motion  abnormalities. Left ventricular diastolic  parameters were normal. The average left ventricular global longitudinal  strain is -14.9 %. The global longitudinal strain is abnormal.   2. Right ventricular systolic function is normal. The right ventricular  size is normal.   3. The mitral valve is grossly normal. No evidence of mitral valve  regurgitation.   4. The aortic valve is tricuspid. Aortic valve regurgitation is not  visualized. No aortic stenosis is present.   5. The inferior vena cava is normal in size with greater than 50%  respiratory variability, suggesting right atrial pressure of 3 mmHg.   Risk Assessment/Calculations:             Physical Exam:   VS:  BP 106/68 (BP Location: Left Arm, Patient Position: Sitting, Cuff Size: Normal)   Pulse 64   Ht 5' 9 (1.753 m)   Wt 186 lb 3.2 oz (84.5 kg)   SpO2 98%   BMI 27.50 kg/m  , BMI Body mass index is 27.5 kg/m. GENERAL:  Well appearing HEENT: Pupils equal round and reactive, fundi not visualized, oral mucosa unremarkable NECK:  No jugular venous distention, waveform within normal limits, carotid upstroke brisk and symmetric, no bruits, no  thyromegaly LUNGS:  Clear to auscultation bilaterally HEART:  RRR.  PMI not displaced or sustained,S1 and S2 within normal limits, no S3, no S4, no clicks, no rubs, no murmurs ABD:  Flat, positive bowel sounds normal in frequency in pitch, no bruits, no rebound, no guarding, no midline pulsatile mass, no hepatomegaly, no splenomegaly EXT:  2 plus pulses throughout, no edema, no cyanosis no clubbing SKIN:  No rashes no nodules NEURO:  Cranial nerves II through XII grossly intact, motor grossly intact throughout PSYCH:  Cognitively  intact, oriented to person place and time   ASSESSMENT AND PLAN: .    Assessment & Plan # Heart failure with reduced ejection fraction Heart failure well-managed, ejection fraction improved to 50%, asymptomatic, blood pressure controlled.  He is interested in stopping some of his medication.  We discussed the high likelihood of recurrence with removing HF therapy.  Compromise by waiting one year to make this consideration.  - Continue current heart failure medications.  Jardiance , losartan , metoprolol  and spironolactone .  Lasix  prn.  - Encouraged regular physical activity, 150 minutes per week. - Reassess heart function in one year for medication adjustment. - Continue to abstain for EtOH  # Aortic atherosclerosis: # Hyperlipidemia:  - Ordered lipid panel to assess cholesterol levels and CMP. - Continue atorvastatin .  # Alcohol use disorder, in remission Alcohol use disorder in remission, abstinence beneficial for heart health. - Continue abstinence from alcohol.  # Tobacco use Continued tobacco use, reduced to half a pack per day, no interest in cessation programs. - Encouraged continued smoking reduction. - Offered support for future smoking cessation.     Dispo: f/u 6 months  Signed, Annabella Scarce, MD   "

## 2024-04-19 ENCOUNTER — Ambulatory Visit: Payer: Self-pay | Admitting: Cardiovascular Disease

## 2024-04-29 ENCOUNTER — Other Ambulatory Visit (HOSPITAL_BASED_OUTPATIENT_CLINIC_OR_DEPARTMENT_OTHER): Payer: Self-pay

## 2024-04-29 ENCOUNTER — Telehealth (HOSPITAL_BASED_OUTPATIENT_CLINIC_OR_DEPARTMENT_OTHER): Payer: Self-pay | Admitting: Licensed Clinical Social Worker

## 2024-04-29 MED ORDER — JARDIANCE 25 MG PO TABS
25.0000 mg | ORAL_TABLET | Freq: Every day | ORAL | 3 refills | Status: AC
  Filled 2024-04-29: qty 30, 30d supply, fill #0
  Filled 2024-04-29: qty 83, 83d supply, fill #0
  Filled 2024-04-29: qty 30, 30d supply, fill #0

## 2024-04-29 NOTE — Addendum Note (Signed)
 Addended by: FREDIRICK BEAU B on: 04/29/2024 06:06 PM   Modules accepted: Orders

## 2024-04-29 NOTE — Telephone Encounter (Signed)
 H&V Care Navigation CSW Progress Note  Clinical Social Worker received a call from pt to share that his Jardiance  copay was going to be $900 or so. Inquired if any assistance. Confirmed full name, DOB, pt insurance still [employer]. Pt may be eligible for copay card assistance, will reach out to patient assistance team to see next steps. Pt agreeable to med being sent to [facility] for us  to be able to more actively ensure copay card completed/used if eligible.   Patient is participating in a Managed Medicaid Plan:  No, [employer] commercial plan   SDOH Screenings   Food Insecurity: Food Insecurity Present (01/27/2024)  Housing: Low Risk (01/27/2024)  Transportation Needs: No Transportation Needs (01/27/2024)  Utilities: Not At Risk (01/27/2024)  Alcohol Screen: Low Risk (12/24/2023)  Financial Resource Strain: Medium Risk (01/27/2024)  Social Connections: Patient Declined (12/21/2023)  Tobacco Use: High Risk (04/16/2024)  Health Literacy: Adequate Health Literacy (01/27/2024)    Marit Lark, MSW, LCSW Clinical Social Worker II [facility] Heart/Vascular Care Navigation  [phone]- work cell phone (preferred)

## 2024-04-29 NOTE — Addendum Note (Signed)
 Addended by: FREDIRICK BEAU B on: 04/29/2024 01:51 PM   Modules accepted: Orders

## 2024-04-29 NOTE — Telephone Encounter (Signed)
"  ° ° ° °-----   Message from Marit VEAR Lark, [place] sent at 04/29/2024 11:35 AM EST -----    Can we have Jardiance  sent to [facility]? Will instruct pt on how to register for copay card and they should be able to fill it for roughly $10 or so. When he had it just refilled without  copay card it was going to cost him about $900.   BIN#- [id number] PCN#- REDGIE ANNELLE BLOCKER- [id number] ID# [id number]   Below from pharmacy today   I tried it again and receive several messages. The limit is 83-day supply so I ran it for 83 and the cost was $799.88. I backed it down to quantity of 30. The copay was $235.92. The official rejection message was  *eVoucherMsg*Not eligible; Co-pay outside threshold range; PHARMACY NOT IN 90 DAY NETWORK, AND 90 DAY SUPPLY REQUIRED ON FUTURE FILLS. So it looks like he can use an outside pharmacy once, and then he would have to use the mail order pharmacy. The eVoucher would not work when I tried it alone without the insurance [phone]). Hope that helps!   Spoke with patient and will reach out to PA/pharmacy team tomorrow.  "

## 2024-04-29 NOTE — Telephone Encounter (Signed)
 H&V Care Navigation CSW Progress Note  Clinical Social Worker received the following information from pt after he spoke with [facility] Cares team.   BIN#- [id number] PCN#- REDGIE HMNLE#[id number] ISSUER- [id number] ID# [id number] .  This was sent to [facility], LPN to see if eligible for further discount at [facility]. Pt inquired about assistance with other medications. Shared that other medications he is on are generics and would not have copay coupons. Can consider using cash price at [facility] if cheaper than copays with his current commercial plan.   Patient is participating in a Managed Medicaid Plan:  no, Child psychotherapist plan  SDOH Screenings   Food Insecurity: Food Insecurity Present (01/27/2024)  Housing: Low Risk (01/27/2024)  Transportation Needs: No Transportation Needs (01/27/2024)  Utilities: Not At Risk (01/27/2024)  Alcohol Screen: Low Risk (12/24/2023)  Financial Resource Strain: Medium Risk (01/27/2024)  Social Connections: Patient Declined (12/21/2023)  Tobacco Use: High Risk (04/16/2024)  Health Literacy: Adequate Health Literacy (01/27/2024)    Marit Lark, MSW, LCSW Clinical Social Worker II [facility] Health Heart/Vascular Care Navigation  [phone]- work cell phone (preferred)

## 2024-04-30 ENCOUNTER — Other Ambulatory Visit (HOSPITAL_COMMUNITY): Payer: Self-pay

## 2024-04-30 ENCOUNTER — Telehealth: Payer: Self-pay | Admitting: Pharmacy Technician

## 2024-04-30 NOTE — Telephone Encounter (Signed)
 farxiga would be 362.57. he can only get 30 days then he has to use a mail order... but he has a very high deductible.. he still has [phone] left on his deductible after running this farxiga. he will be paying a coinsurance price on his meds until that deductible is met. same for the jardiance  - 335.92 for 30 days leaving [phone] left on his deductible. coupons only take off a small amount, that's why even with the coupon cost is 235.92 for 30 days for the jardiance . same will be with a farxiga coupon.   Above message received from PA team  Spoke with patient and advised.   Will forward to Reche ORN NP and Rosaline RAMAN NP for review since Dr Raford out of the office

## 2024-04-30 NOTE — Telephone Encounter (Signed)
 farxiga would be 362.57 for 30 days. he can only get 30 days then he has to use a mail order... but he has a very high deductible.. he still has [phone] left on his deductible after running this farxiga. he will be paying a coinsurance price on his meds until that deductible is met.   same for the jardiance  - 335.92 for 30 days leaving [phone] left on his deductible. coupons only take off a small amount, that's why even with the coupon cost is 235.92 for 30 days for the jardiance . same will be with a farxiga coupon.
